# Patient Record
Sex: Female | Born: 1935 | Race: White | Hispanic: No | Smoking: Never smoker
Health system: Southern US, Community
[De-identification: ages and names within clinical notes are randomized; demographics above are authoritative.]

## PROBLEM LIST (undated history)

## (undated) DIAGNOSIS — I10 Essential (primary) hypertension: Secondary | ICD-10-CM

## (undated) DIAGNOSIS — E785 Hyperlipidemia, unspecified: Secondary | ICD-10-CM

## (undated) DIAGNOSIS — I2510 Atherosclerotic heart disease of native coronary artery without angina pectoris: Secondary | ICD-10-CM

## (undated) DIAGNOSIS — F0391 Unspecified dementia with behavioral disturbance: Secondary | ICD-10-CM

## (undated) DIAGNOSIS — M1990 Unspecified osteoarthritis, unspecified site: Secondary | ICD-10-CM

## (undated) DIAGNOSIS — E039 Hypothyroidism, unspecified: Secondary | ICD-10-CM

## (undated) DIAGNOSIS — D649 Anemia, unspecified: Secondary | ICD-10-CM

## (undated) DIAGNOSIS — K5790 Diverticulosis of intestine, part unspecified, without perforation or abscess without bleeding: Secondary | ICD-10-CM

## (undated) HISTORY — DX: Anemia, unspecified: D64.9

## (undated) HISTORY — PX: TOTAL ABDOMINAL HYSTERECTOMY: SHX209

## (undated) HISTORY — DX: Atherosclerotic heart disease of native coronary artery without angina pectoris: I25.10

## (undated) HISTORY — PX: CATARACT EXTRACTION: SUR2

## (undated) HISTORY — DX: Unspecified osteoarthritis, unspecified site: M19.90

## (undated) HISTORY — DX: Hyperlipidemia, unspecified: E78.5

## (undated) HISTORY — DX: Diverticulosis of intestine, part unspecified, without perforation or abscess without bleeding: K57.90

## (undated) HISTORY — PX: TONSILLECTOMY AND ADENOIDECTOMY: SUR1326

## (undated) HISTORY — PX: MELANOMA EXCISION: SHX5266

## (undated) HISTORY — DX: Essential (primary) hypertension: I10

## (undated) HISTORY — DX: Hypothyroidism, unspecified: E03.9

---

## 1998-08-05 HISTORY — PX: CORONARY ARTERY BYPASS GRAFT: SHX141

## 1998-08-16 ENCOUNTER — Inpatient Hospital Stay (HOSPITAL_COMMUNITY): Admission: AD | Admit: 1998-08-16 | Discharge: 1998-08-25 | Payer: Self-pay | Admitting: Cardiology

## 1998-08-20 ENCOUNTER — Encounter: Payer: Self-pay | Admitting: Thoracic Surgery (Cardiothoracic Vascular Surgery)

## 1998-08-21 ENCOUNTER — Encounter: Payer: Self-pay | Admitting: Thoracic Surgery (Cardiothoracic Vascular Surgery)

## 1998-08-23 ENCOUNTER — Encounter: Payer: Self-pay | Admitting: Thoracic Surgery (Cardiothoracic Vascular Surgery)

## 1998-08-29 ENCOUNTER — Inpatient Hospital Stay (HOSPITAL_COMMUNITY): Admission: EM | Admit: 1998-08-29 | Discharge: 1998-08-30 | Payer: Self-pay | Admitting: Emergency Medicine

## 1998-08-29 ENCOUNTER — Encounter: Payer: Self-pay | Admitting: Emergency Medicine

## 1998-09-03 ENCOUNTER — Encounter: Payer: Self-pay | Admitting: Thoracic Surgery (Cardiothoracic Vascular Surgery)

## 1998-09-03 ENCOUNTER — Emergency Department (HOSPITAL_COMMUNITY): Admission: EM | Admit: 1998-09-03 | Discharge: 1998-09-03 | Payer: Self-pay | Admitting: Emergency Medicine

## 1998-09-16 ENCOUNTER — Encounter (INDEPENDENT_AMBULATORY_CARE_PROVIDER_SITE_OTHER): Payer: Self-pay | Admitting: *Deleted

## 2000-08-11 ENCOUNTER — Encounter: Payer: Self-pay | Admitting: Internal Medicine

## 2000-08-11 ENCOUNTER — Ambulatory Visit (HOSPITAL_COMMUNITY): Admission: RE | Admit: 2000-08-11 | Discharge: 2000-08-11 | Payer: Self-pay | Admitting: Internal Medicine

## 2000-08-20 ENCOUNTER — Encounter: Payer: Self-pay | Admitting: Internal Medicine

## 2000-08-20 ENCOUNTER — Ambulatory Visit (HOSPITAL_COMMUNITY): Admission: RE | Admit: 2000-08-20 | Discharge: 2000-08-20 | Payer: Self-pay | Admitting: Internal Medicine

## 2000-09-03 ENCOUNTER — Encounter: Payer: Self-pay | Admitting: Internal Medicine

## 2000-09-03 ENCOUNTER — Ambulatory Visit (HOSPITAL_COMMUNITY): Admission: RE | Admit: 2000-09-03 | Discharge: 2000-09-03 | Payer: Self-pay | Admitting: Internal Medicine

## 2001-06-09 ENCOUNTER — Encounter: Payer: Self-pay | Admitting: Family Medicine

## 2001-06-09 ENCOUNTER — Ambulatory Visit (HOSPITAL_COMMUNITY): Admission: RE | Admit: 2001-06-09 | Discharge: 2001-06-09 | Payer: Self-pay | Admitting: Family Medicine

## 2001-08-15 ENCOUNTER — Encounter: Payer: Self-pay | Admitting: Family Medicine

## 2001-08-15 ENCOUNTER — Ambulatory Visit (HOSPITAL_COMMUNITY): Admission: RE | Admit: 2001-08-15 | Discharge: 2001-08-15 | Payer: Self-pay | Admitting: Family Medicine

## 2002-07-17 ENCOUNTER — Ambulatory Visit (HOSPITAL_COMMUNITY): Admission: RE | Admit: 2002-07-17 | Discharge: 2002-07-17 | Payer: Self-pay | Admitting: Family Medicine

## 2002-07-17 ENCOUNTER — Encounter: Payer: Self-pay | Admitting: Family Medicine

## 2002-08-24 ENCOUNTER — Ambulatory Visit (HOSPITAL_COMMUNITY): Admission: RE | Admit: 2002-08-24 | Discharge: 2002-08-24 | Payer: Self-pay | Admitting: Family Medicine

## 2002-08-24 ENCOUNTER — Encounter: Payer: Self-pay | Admitting: Family Medicine

## 2003-01-11 ENCOUNTER — Encounter: Payer: Self-pay | Admitting: Family Medicine

## 2003-01-11 ENCOUNTER — Ambulatory Visit (HOSPITAL_COMMUNITY): Admission: RE | Admit: 2003-01-11 | Discharge: 2003-01-11 | Payer: Self-pay | Admitting: Family Medicine

## 2003-04-09 ENCOUNTER — Ambulatory Visit (HOSPITAL_COMMUNITY): Admission: RE | Admit: 2003-04-09 | Discharge: 2003-04-09 | Payer: Self-pay | Admitting: Family Medicine

## 2003-04-17 ENCOUNTER — Ambulatory Visit (HOSPITAL_COMMUNITY): Admission: RE | Admit: 2003-04-17 | Discharge: 2003-04-17 | Payer: Self-pay | Admitting: Family Medicine

## 2003-04-18 ENCOUNTER — Inpatient Hospital Stay (HOSPITAL_COMMUNITY): Admission: AD | Admit: 2003-04-18 | Discharge: 2003-04-21 | Payer: Self-pay | Admitting: Family Medicine

## 2003-04-30 ENCOUNTER — Ambulatory Visit (HOSPITAL_COMMUNITY): Admission: RE | Admit: 2003-04-30 | Discharge: 2003-04-30 | Payer: Self-pay | Admitting: Family Medicine

## 2003-10-03 ENCOUNTER — Ambulatory Visit (HOSPITAL_COMMUNITY): Admission: RE | Admit: 2003-10-03 | Discharge: 2003-10-03 | Payer: Self-pay | Admitting: Family Medicine

## 2003-10-09 ENCOUNTER — Ambulatory Visit (HOSPITAL_COMMUNITY): Admission: RE | Admit: 2003-10-09 | Discharge: 2003-10-09 | Payer: Self-pay | Admitting: Family Medicine

## 2004-07-14 ENCOUNTER — Ambulatory Visit (HOSPITAL_COMMUNITY): Admission: RE | Admit: 2004-07-14 | Discharge: 2004-07-14 | Payer: Self-pay | Admitting: Family Medicine

## 2004-07-30 IMAGING — US US ABDOMEN COMPLETE
1 series · 14 of 25 positions shown · non-contrast
Comparison: none

CLINICAL DATA: Abdominal pain; nausea
ULTRASOUND ABDOMEN COMPLETE
Gallbladder physiologically distended without stones or wall thickening.  Common bile duct normal caliber 3 mm diameter.  Liver, spleen, and kidneys unremarkable.  Kidneys measure 11.5 cm length right and 11.2 cm length left.  Aorta and IVC normal appearance.
Bilobed cystic nodule identified at pancreatic head.  This measures 2.4 x 1.8 x 1.7 cm in size.  This contains a single septation versus two adjacent cystic areas.  Features are nonspecific by ultrasound.  The smaller of the cystic loculation appears to contain low level echogenicity. 
IMPRESSION
Complicated cystic mass, head of pancreas.  This corresponds in size and position with the CT abnormality.  One of the locules appears mildly complicated by ultrasound.  MRI of the pancreas with and without contrast recommended to characterize this lesion. 
Remainder of exam unremarkable.

[Series 1: unknown · 0.28mm/px · 14 of 63 slices shown]
[im 1/63]
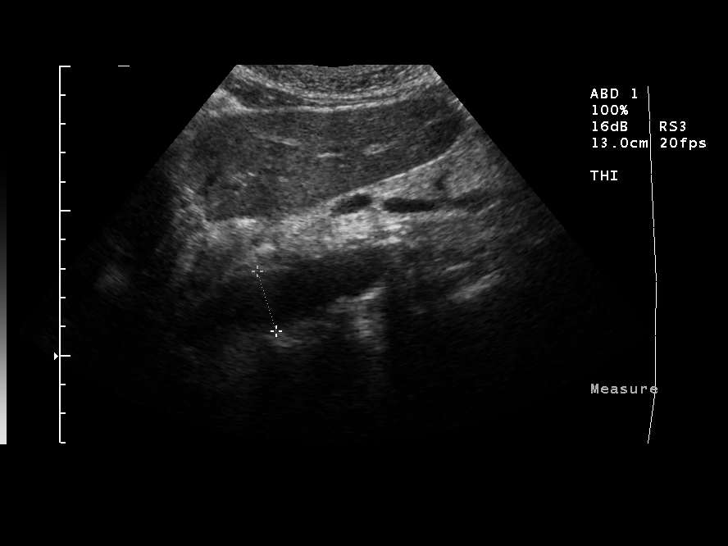
[im 6/63]
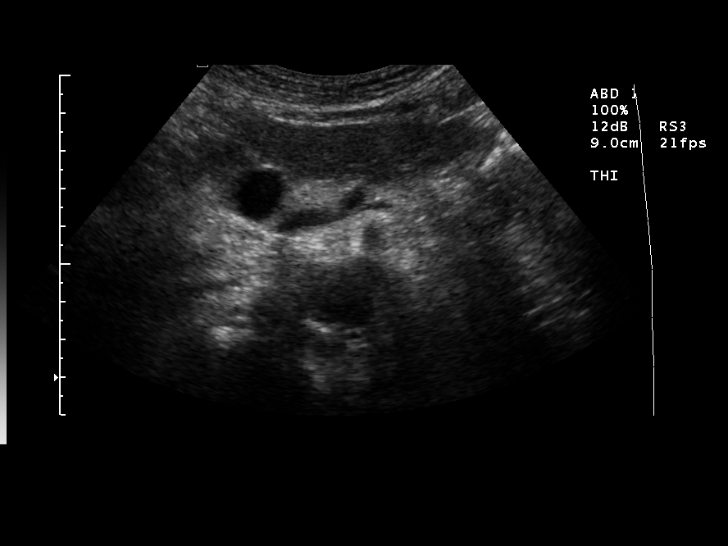
[im 11/63]
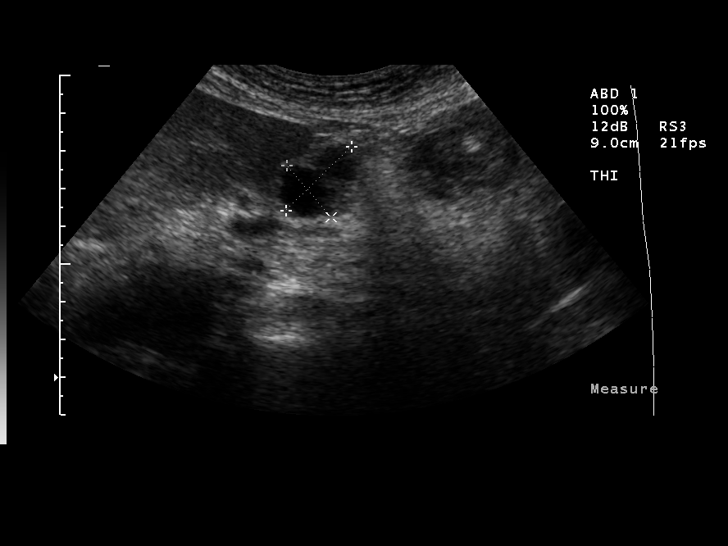
[im 16/63]
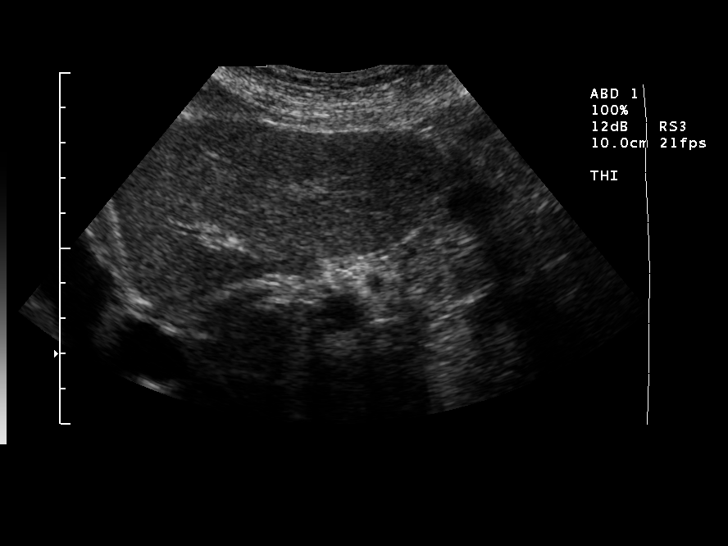
[im 21/63]
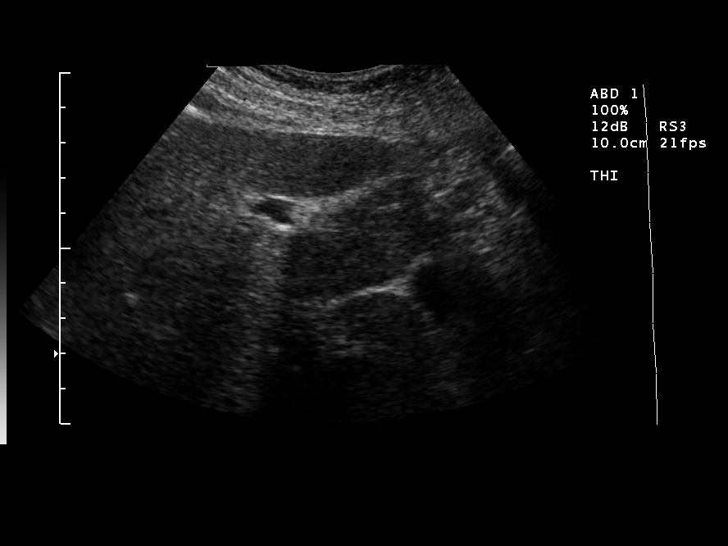
[im 24/63]
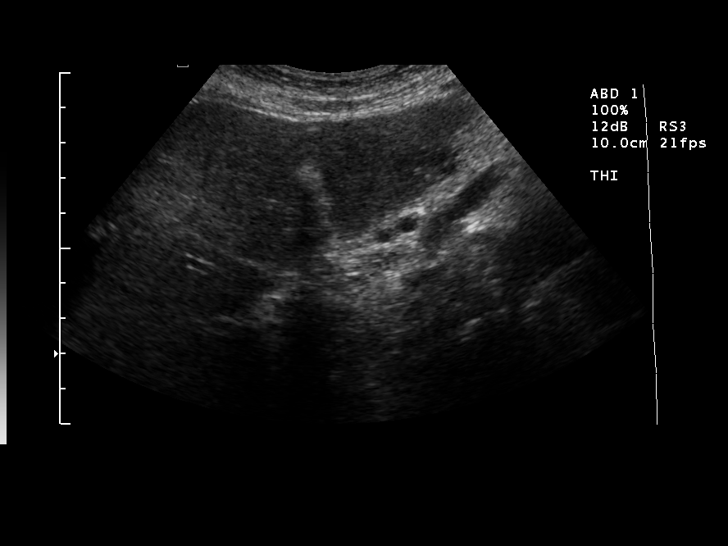
[im 29/63]
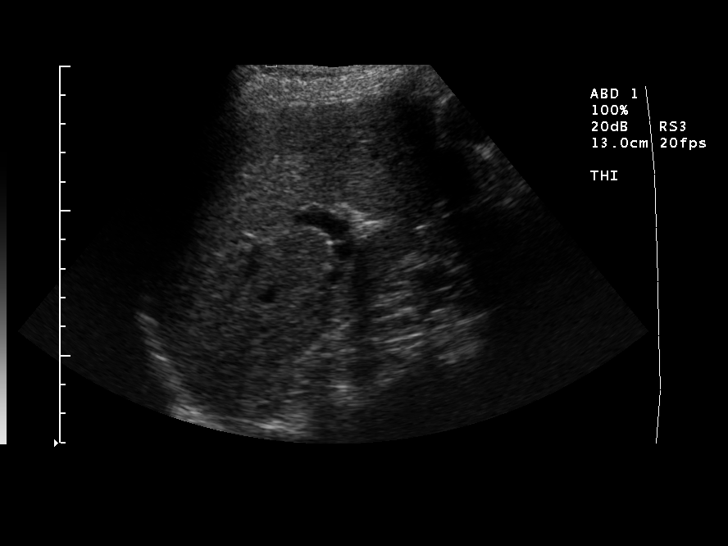
[im 34/63]
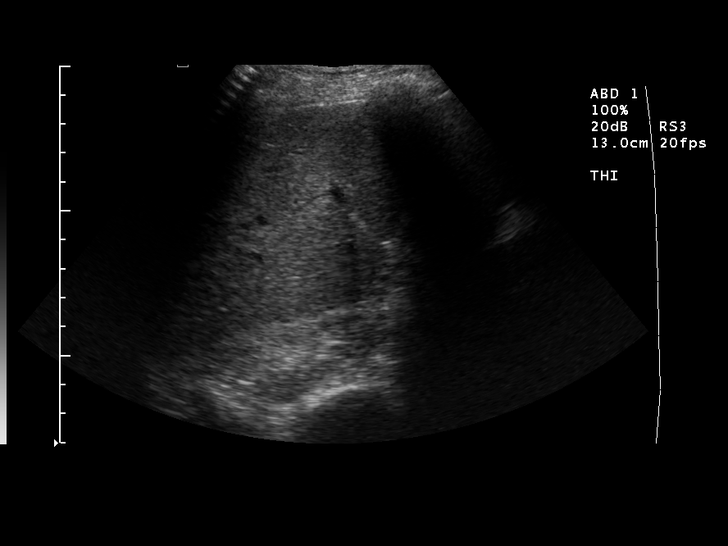
[im 39/63]
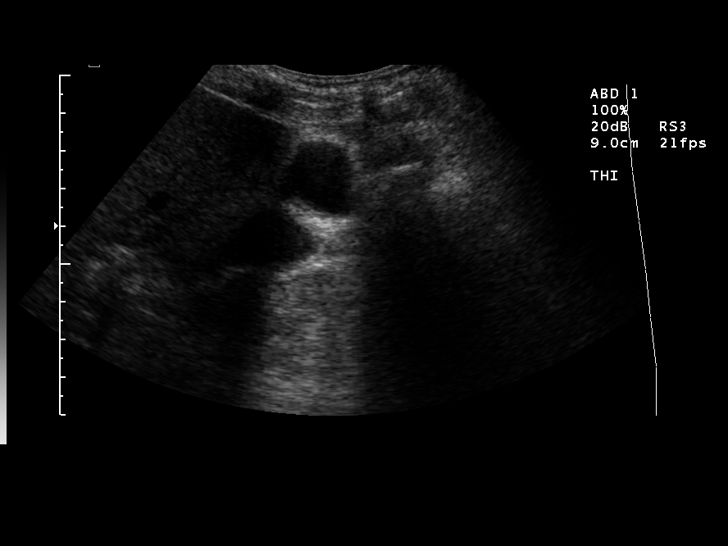
[im 42/63]
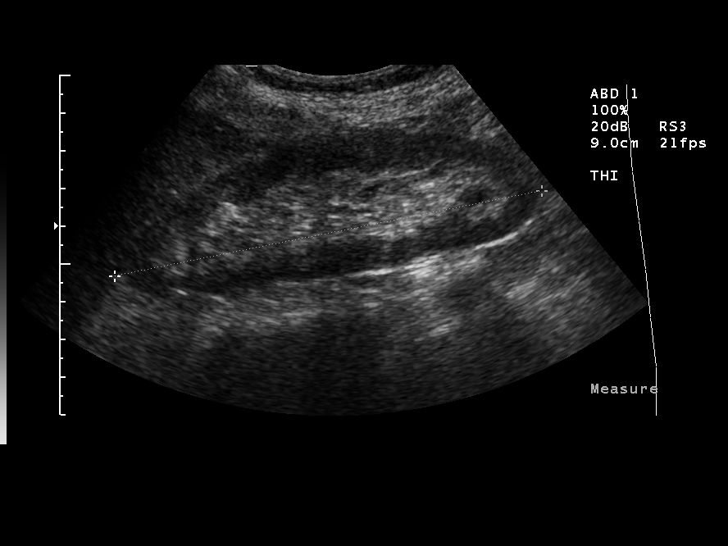
[im 47/63]
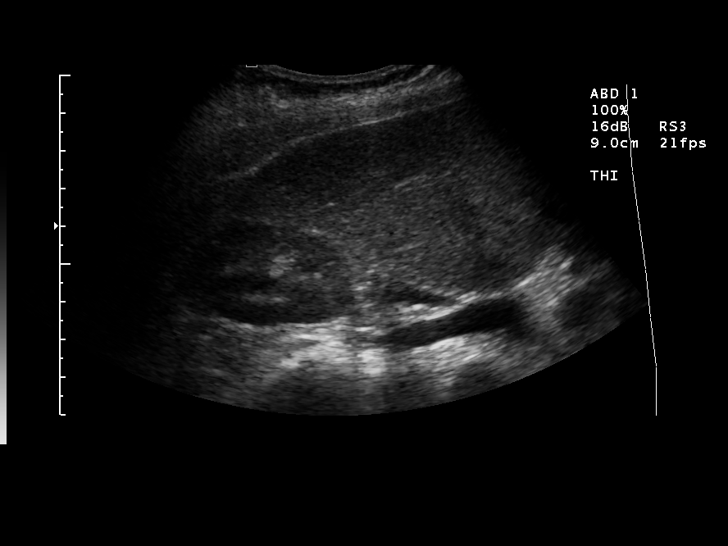
[im 52/63]
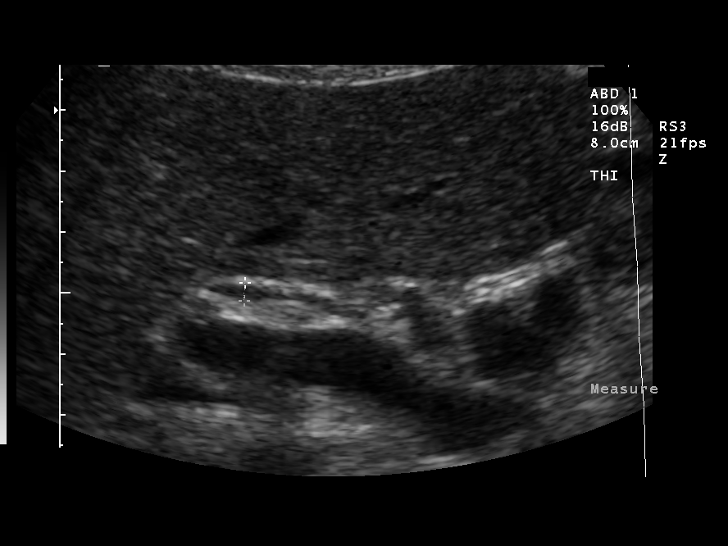
[im 57/63]
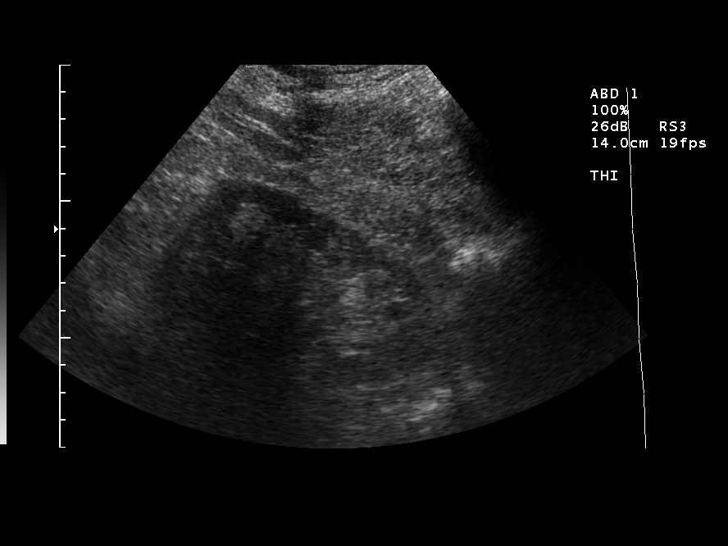
[im 63/63]
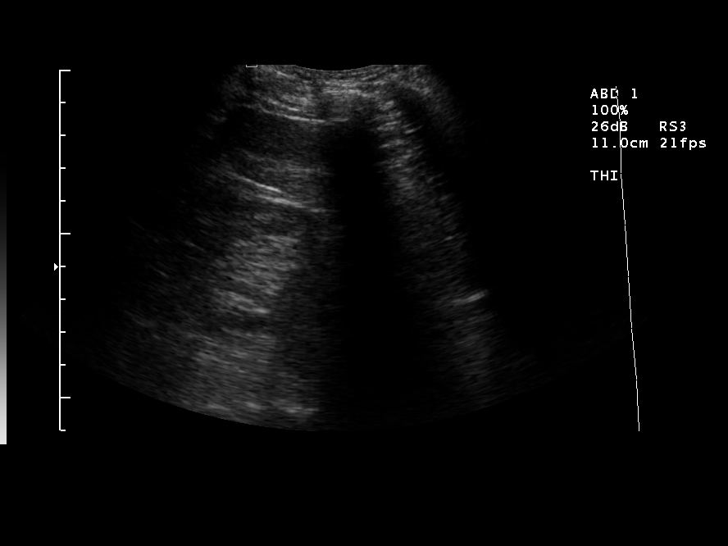

[14 of 25 positions shown; findings below may reference images not displayed]

## 2004-11-05 ENCOUNTER — Ambulatory Visit (HOSPITAL_COMMUNITY): Admission: RE | Admit: 2004-11-05 | Discharge: 2004-11-05 | Payer: Self-pay | Admitting: Family Medicine

## 2005-01-30 ENCOUNTER — Ambulatory Visit (HOSPITAL_COMMUNITY): Admission: RE | Admit: 2005-01-30 | Discharge: 2005-01-30 | Payer: Self-pay | Admitting: Family Medicine

## 2005-02-13 ENCOUNTER — Ambulatory Visit: Payer: Self-pay | Admitting: Cardiology

## 2005-06-03 ENCOUNTER — Ambulatory Visit (HOSPITAL_COMMUNITY): Admission: RE | Admit: 2005-06-03 | Discharge: 2005-06-03 | Payer: Self-pay | Admitting: Family Medicine

## 2005-06-05 ENCOUNTER — Ambulatory Visit (HOSPITAL_COMMUNITY): Admission: RE | Admit: 2005-06-05 | Discharge: 2005-06-05 | Payer: Self-pay | Admitting: Family Medicine

## 2005-06-08 ENCOUNTER — Ambulatory Visit: Payer: Self-pay | Admitting: Cardiology

## 2005-06-11 ENCOUNTER — Ambulatory Visit (HOSPITAL_COMMUNITY): Admission: RE | Admit: 2005-06-11 | Discharge: 2005-06-11 | Payer: Self-pay | Admitting: Family Medicine

## 2005-07-21 ENCOUNTER — Inpatient Hospital Stay (HOSPITAL_COMMUNITY): Admission: RE | Admit: 2005-07-21 | Discharge: 2005-07-24 | Payer: Self-pay | Admitting: Orthopedic Surgery

## 2005-08-05 ENCOUNTER — Encounter: Payer: Self-pay | Admitting: Emergency Medicine

## 2005-11-09 ENCOUNTER — Encounter (HOSPITAL_COMMUNITY): Admission: RE | Admit: 2005-11-09 | Discharge: 2005-12-09 | Payer: Self-pay | Admitting: Endocrinology

## 2005-12-29 ENCOUNTER — Encounter (HOSPITAL_COMMUNITY): Admission: RE | Admit: 2005-12-29 | Discharge: 2006-01-01 | Payer: Self-pay | Admitting: Endocrinology

## 2006-02-04 ENCOUNTER — Ambulatory Visit (HOSPITAL_COMMUNITY): Admission: RE | Admit: 2006-02-04 | Discharge: 2006-02-04 | Payer: Self-pay | Admitting: Internal Medicine

## 2006-12-10 ENCOUNTER — Ambulatory Visit: Payer: Self-pay | Admitting: Cardiology

## 2007-02-21 ENCOUNTER — Ambulatory Visit (HOSPITAL_COMMUNITY): Admission: RE | Admit: 2007-02-21 | Discharge: 2007-02-21 | Payer: Self-pay | Admitting: Family Medicine

## 2008-10-01 DIAGNOSIS — K5730 Diverticulosis of large intestine without perforation or abscess without bleeding: Secondary | ICD-10-CM | POA: Insufficient documentation

## 2008-10-01 DIAGNOSIS — H9190 Unspecified hearing loss, unspecified ear: Secondary | ICD-10-CM

## 2008-10-01 DIAGNOSIS — M1710 Unilateral primary osteoarthritis, unspecified knee: Secondary | ICD-10-CM

## 2008-11-07 ENCOUNTER — Ambulatory Visit: Payer: Self-pay | Admitting: Cardiology

## 2008-11-07 ENCOUNTER — Encounter: Payer: Self-pay | Admitting: Cardiology

## 2008-11-07 DIAGNOSIS — I2510 Atherosclerotic heart disease of native coronary artery without angina pectoris: Secondary | ICD-10-CM | POA: Insufficient documentation

## 2009-05-23 ENCOUNTER — Encounter (INDEPENDENT_AMBULATORY_CARE_PROVIDER_SITE_OTHER): Payer: Self-pay | Admitting: *Deleted

## 2009-05-23 LAB — CONVERTED CEMR LAB
AST: 12 units/L
Albumin: 4.2 g/dL
Alkaline Phosphatase: 113 units/L
CO2: 95 meq/L
Chloride: 22 meq/L
Creatinine, Ser: 0.82 mg/dL
HCT: 35.7 %
Hemoglobin: 11.3 g/dL
LDL Cholesterol: 106 mg/dL
Platelets: 288 10*3/uL
Sodium: 4.7 meq/L
Total Protein: 6.6 g/dL
WBC: 7.9 10*3/uL

## 2009-05-24 ENCOUNTER — Encounter (INDEPENDENT_AMBULATORY_CARE_PROVIDER_SITE_OTHER): Payer: Self-pay | Admitting: *Deleted

## 2009-11-08 ENCOUNTER — Encounter (INDEPENDENT_AMBULATORY_CARE_PROVIDER_SITE_OTHER): Payer: Self-pay | Admitting: *Deleted

## 2009-11-08 LAB — CONVERTED CEMR LAB
AST: 13 units/L
Alkaline Phosphatase: 95 units/L
BUN: 12 mg/dL
Basophils Relative: 0 %
Eosinophils Absolute: 0.2 10*3/uL
Glucose, Bld: 93 mg/dL
HCT: 36.3 %
Hemoglobin: 11.8 g/dL
MCHC: 32.5 g/dL
Monocytes Relative: 7 %
RDW: 15.2 %

## 2009-12-04 ENCOUNTER — Encounter (INDEPENDENT_AMBULATORY_CARE_PROVIDER_SITE_OTHER): Payer: Self-pay | Admitting: *Deleted

## 2009-12-06 ENCOUNTER — Ambulatory Visit: Payer: Self-pay | Admitting: Cardiology

## 2009-12-06 DIAGNOSIS — D649 Anemia, unspecified: Secondary | ICD-10-CM | POA: Insufficient documentation

## 2009-12-06 DIAGNOSIS — E018 Other iodine-deficiency related thyroid disorders and allied conditions: Secondary | ICD-10-CM

## 2010-04-27 ENCOUNTER — Encounter: Payer: Self-pay | Admitting: Family Medicine

## 2010-05-06 NOTE — Miscellaneous (Signed)
Summary: LABS CMP,CBCD,TSH,11/08/2009  Clinical Lists Changes  Observations: Added new observation of ABSOLUTE BAS: 0.0 K/uL (11/08/2009 10:53) Added new observation of BASOPHIL %: 0 % (11/08/2009 10:53) Added new observation of EOS ABSLT: 0.2 K/uL (11/08/2009 10:53) Added new observation of % EOS AUTO: 2 % (11/08/2009 10:53) Added new observation of ABSOLUTE MON: 0.6 K/uL (11/08/2009 10:53) Added new observation of MONOCYTE %: 7 % (11/08/2009 10:53) Added new observation of ABS LYMPHOCY: 2.0 K/uL (11/08/2009 10:53) Added new observation of LYMPHS %: 23 % (11/08/2009 10:53) Added new observation of PLATELETK/UL: 295 K/uL (11/08/2009 10:53) Added new observation of RDW: 15.2 % (11/08/2009 10:53) Added new observation of MCHC RBC: 32.5 g/dL (16/01/9603 54:09) Added new observation of MCV: 92.1 fL (11/08/2009 10:53) Added new observation of HCT: 36.3 % (11/08/2009 10:53) Added new observation of HGB: 11.8 g/dL (81/19/1478 29:56) Added new observation of RBC M/UL: 3.94 M/uL (11/08/2009 10:53) Added new observation of WBC COUNT: 8.6 10*3/microliter (11/08/2009 10:53) Added new observation of CALCIUM: 9.3 mg/dL (21/30/8657 84:69) Added new observation of ALBUMIN: 4.1 g/dL (62/95/2841 32:44) Added new observation of PROTEIN, TOT: 6.7 g/dL (04/08/7251 66:44) Added new observation of SGPT (ALT): 9 units/L (11/08/2009 10:53) Added new observation of SGOT (AST): 13 units/L (11/08/2009 10:53) Added new observation of ALK PHOS: 95 units/L (11/08/2009 10:53) Added new observation of CREATININE: 0.75 mg/dL (03/47/4259 56:38) Added new observation of BUN: 12 mg/dL (75/64/3329 51:88) Added new observation of BG RANDOM: 93 mg/dL (41/66/0630 16:01) Added new observation of CO2 PLSM/SER: 26 meq/L (11/08/2009 10:53) Added new observation of CL SERUM: 98 meq/L (11/08/2009 10:53) Added new observation of K SERUM: 4.9 meq/L (11/08/2009 10:53) Added new observation of NA: 134 meq/L (11/08/2009 10:53) Added  new observation of TSH: 2.657 microintl units/mL (11/08/2009 10:53)

## 2010-05-06 NOTE — Miscellaneous (Signed)
Summary: cmp,cbc,lipids,tsh  Clinical Lists Changes  Observations: Added new observation of CALCIUM: 9.6 mg/dL (16/01/9603 54:09) Added new observation of ALBUMIN: 4.2 g/dL (81/19/1478 29:56) Added new observation of PROTEIN, TOT: 6.6 g/dL (21/30/8657 84:69) Added new observation of SGPT (ALT): 8 units/L (05/23/2009 15:44) Added new observation of SGOT (AST): 12 units/L (05/23/2009 15:44) Added new observation of ALK PHOS: 113 units/L (05/23/2009 15:44) Added new observation of BILI DIRECT: total bili    0.3 mg/dL (62/95/2841 32:44) Added new observation of GFR AA: >60 mL/min/1.53m2 (05/23/2009 15:44) Added new observation of GFR: >60 mL/min (05/23/2009 15:44) Added new observation of CREATININE: 0.82 mg/dL (04/08/7251 66:44) Added new observation of BUN: 18 mg/dL (03/47/4259 56:38) Added new observation of BG RANDOM: 18 mg/dL (75/64/3329 51:88) Added new observation of CO2 PLSM/SER: 95 meq/L (05/23/2009 15:44) Added new observation of CL SERUM: 22 meq/L (05/23/2009 15:44) Added new observation of K SERUM: 98. meq/L (05/23/2009 15:44) Added new observation of NA: 4.7 meq/L (05/23/2009 15:44) Added new observation of LDL: 106 mg/dL (41/66/0630 16:01) Added new observation of HDL: 49 mg/dL (09/32/3557 32:20) Added new observation of TRIGLYC TOT: 134 mg/dL (25/42/7062 37:62) Added new observation of CHOLESTEROL: 182 mg/dL (83/15/1761 60:73) Added new observation of PLATELETK/UL: 288 K/uL (05/23/2009 15:44) Added new observation of MCV: 91.3 fL (05/23/2009 15:44) Added new observation of HCT: 35.7 % (05/23/2009 15:44) Added new observation of HGB: 11.3 g/dL (71/09/2692 85:46) Added new observation of WBC COUNT: 7.9 10*3/microliter (05/23/2009 15:44) Added new observation of TSH: 3.252 microintl units/mL (05/23/2009 15:44)

## 2010-05-06 NOTE — Assessment & Plan Note (Signed)
Summary: 1 YR F/U PER CHECKOUT ON 11/07/08/TG  Medications Added XYZAL 5 MG TABS (LEVOCETIRIZINE DIHYDROCHLORIDE) take 1 tab daily      Allergies Added: NKDA  Visit Type:  Follow-up Primary Provider:  Dr. Assunta Found   History of Present Illness: Return visit for this remarkable, nearly 77 year old woman, who is now 11 years status post CABG surgery.  She lives alone and drives, but has family in the area to assist her as necessary.  She has an active lifestyle for a woman her age, but does not exercise regularly.  She denies dyspnea, orthopnea, PND, chest discomfort, lightheadedness and syncope.  She feels that her balance is somewhat impaired, but has not fallen.  Recent laboratory studies were excellent, but she was told to start a multivitamin containing iron after those results became available.  Current Medications (verified): 1)  Aspirin 81 Mg Tbec (Aspirin) .... Take One Tablet By Mouth Daily 2)  Metoprolol Tartrate 100 Mg Tabs (Metoprolol Tartrate) .... Take One Tablet By Mouth Twice A Day 3)  Synthroid 50 Mcg Tabs (Levothyroxine Sodium) .... Take 1 Tablet By Mouth Once A Day 4)  Benicar 40 Mg Tabs (Olmesartan Medoxomil) .... Take 1 Tablet By Mouth Once A Day 5)  Xyzal 5 Mg Tabs (Levocetirizine Dihydrochloride) .... Take 1 Tab Daily  Allergies (verified): No Known Drug Allergies  Past History:  PMH, FH, and Social History reviewed and updated.  Past Medical History: ASCVD-CABG surgery in 5/00 HYPERTENSION, UNSPECIFIED (ICD-401.9) HYPERLIPIDEMIA Anemia-mild; hemoglobin of 11.5-12 Hearing Impairment Hypothyroid: Initially presented with toxic goiter; levothyroxine Rx needed after RAI in 2008 DIVERTICULAR DISEASE (ICD-562.10) DEGENERATIVE JOINT DISEASE, LEFT KNEE (ICD-715.96)     Past Surgical History: CABG- 08/1998 T&A Abdominal Hysterectomy-Total Melanoma removed from the right arm Bladder resuspension Cataract extraction with lens implantation  Review of  Systems       See history of present illness  Vital Signs:  Patient profile:   77 year old female Height:      63 inches Weight:      122 pounds BMI:     21.69 Pulse rate:   60 / minute BP sitting:   133 / 69  (right arm)  Vitals Entered By: Dreama Saa, CNA (December 06, 2009 2:39 PM)  Physical Exam  General:  Elderly woman who is well developed; no acute distress; impaired hearing only modestly limits speech comprehension Neck-No JVD; no carotid bruits: Lungs-No tachypnea, no rales; no rhonchi; no wheezes: Cardiovascular-normal S1; increased intensity of S2 with normal split; minimal early systolic ejection murmur: Abdomen-BS normal; soft and non-tender without masses or organomegaly:  Musculoskeletal-No deformities, no cyanosis or clubbing: Neurologic-Normal cranial nerves; symmetric strength and tone:  Skin-Warm, no significant lesions: Extremities-1-2+ distal pulses except for a decreased left posterior tibial; when assessed with Doppler, signal is monophasic and deformed suggesting significant obstruction; trace edema:     Impression & Recommendations:  Problem # 1:  ATHEROSCLEROTIC CARDIOVASCULAR DISEASE (ICD-429.2) Patient is asymptomatic and requires no additional medical therapy for this problem.  Problem # 2:  HYPERTENSION, UNSPECIFIED (ICD-401.9) Blood pressure control is good, and current medications will be continued.  I will plan to reassess this nice woman in one year.  Patient Instructions: 1)  Your physician recommends that you schedule a follow-up appointment in: 1 year 2)  Your physician recommends that you continue on your current medications as directed. Please refer to the Current Medication list given to you today.

## 2010-08-19 NOTE — Letter (Signed)
December 10, 2006    Olivia Nixon, M.D.  [address] [place]. [place], [place] [address]   RE:  Olivia Nixon, Olivia Nixon  MRN:  [id number]  /  DOB:  1917-11-28   Dear Olivia Nixon:   Olivia Nixon returns to the office for continued assessment and treatment  of cardiovascular risk factors, now 8 years following CABG surgery.  She  remains asymptomatic from a cardiac standpoint.  She has undergone left  total knee arthroplasty and thyroid ablation for a toxic goiter over the  past 18 months or so.  She remains remarkably functional, except for  markedly decreased hearing acuity.   CURRENT MEDICATIONS:  1. Aspirin 81 mg daily.  2. Lisinopril 10 mg daily.  3. Lunesta 2 mg nightly p.r.n.  4. Levothyroxine 50 mcg daily.  5. Clarinex 5 mg daily.  6. Metoprolol 100 mg b.i.d.  7. She also takes an unknown nonsteroidal antiinflammatory for her      arthritis.   EXAM:  [name].  Weight 126 pounds, 3 pounds less than in March 2007.  Blood pressure  125/85, heart rate 65 and regular, respirations 18.  NECK:  No jugular venous distension; no carotid bruits.  LUNGS:  Clear.  CARDIAC:  Normal first heart sound; increased intensity of the second  heart sound; fourth heart sound and modest basalar systolic ejection  murmur noted.  ABDOMEN:  Soft and nontender; normal bowel sounds; no bruits; no masses;  no organomegaly.  EXTREMITIES:  Distal pulses intact; no edema.   LABORATORY:  Obtained today, pending.  Most recent testing available to  me was in January, at which time CBC was normal, as was chemistry  profile.  Her cholesterol was elevated at 219 with triglycerides 105,  HDL 51, and LDL of 147.   IMPRESSION:  Olivia Nixon is doing generally well.  She has taken both  Lipitor and Vytorin in the past, but has discontinued these.  I told her  that it is reasonably important for her to take the lipid lowering  medication.  She will start simvastatin 40 mg daily and return to see me  in 1  year.  Hypertension is well controlled.  She is advised to wear her  hearing aids.    Sincerely,      Olivia Friends. Dietrich Pates, MD, [facility]  Electronically Signed    RMR/MedQ  DD: 12/10/2006  DT: 12/10/2006  Job #: [id number]

## 2010-08-22 NOTE — H&P (Signed)
Olivia Nixon, Olivia Nixon                 ACCOUNT NO.:  [id number]   MEDICAL RECORD NO.:  [id number]          PATIENT TYPE:  INP   LOCATION:  NA                           FACILITY:  [facility]   PHYSICIAN:  Olivia Nixon, M.D.  DATE OF BIRTH:  June 23, 1917   DATE OF ADMISSION:  07/21/2005  DATE OF DISCHARGE:                                HISTORY & PHYSICAL   CHIEF COMPLAINT:  Pain in my knees, worse on left than right.   HISTORY OF PRESENT ILLNESS:  This is an 77 year old lady who has had  progressive problems concerning pain into her knees.  She has had more and  more difficulty getting about. She is a very active lady and is highly  desirous to improve her overall lifestyle.  She now has to use a walker for  any kind of ambulation.  She also has difficulty getting to a standing  position from a seated position.  X-rays have shown bone on bone deformities  in both knees, worse on the left than the right. She has crepitus of range  of motion.  This is also quite painful.  The patient has been cleared  preoperatively by Dr. Corrie Mckusick at [facility] in  [place], [place].  After much discussion, including the risks and  benefits of surgery, it was felt this patient would benefit with surgical  intervention and is being admitted for total knee replacement arthroplasty  on the left.  If she does well with this procedure and is medically stable,  we will probably go ahead with total knee replacement arthroplasty on the  right knee in the not too distant future.   PAST MEDICAL HISTORY:  1.  This patient had a myocardial infarction in the year 2000.  She had a      cardiac catheterization.  2.  She also has diverticulitis.  3.  Hemorrhoids.  4.  Osteoarthritis is her most significant problem.   ALLERGIES:  SULFA, CODEINE, CHLORTHALIDONE, AMBIEN, VALIUM, REQUIP,  DARVOCET, CELEBREX, ORUVAIL, VOLTAREN, DULCOLAX, CIPRO, FLAGYL, INTRAVENOUS  IODINE PREPARATIONS (such as  IVP dye).   CURRENT MEDICATIONS:  1.  Metoprolol one tablet b.i.d.  2.  Vytorin one tablet daily.  3.  Baby aspirin (which she will stop prior to surgery).  4.  Tylenol, two t.i.d.  5.  Os-Cal.  6.  Lunesta at bedtime.  7.  She is now using Zyrtec as an allergen.   PAST SURGICAL HISTORY:  Include a tonsillectomy and adenoidectomy, cardiac  catheterization, hysterectomy, melanoma removed from the right arm, bladder  tacking and lens implants.   FAMILY HISTORY:  Noncontributory.   SOCIAL HISTORY:  The patient is widowed.  She is retired.  She has no intake  of alcohol or tobacco products.  Her sister will probably be her caregiver  after surgery.   REVIEW OF SYSTEMS:  CNS:  No seizure disorder, paralysis, numbness, double  vision.  RESPIRATORY:  No productive cough, no hemoptysis, no shortness of  breath.  CARDIOVASCULAR:  No chest pain.  No angina, no orthopnea.  GASTROINTESTINAL:  No  nausea, vomiting, melena or bloody stools.  GENITOURINARY:  No discharge, dysuria or hematuria. MUSCULOSKELETAL:  Primarily in present illness with her knees.  She also has other multiple  arthralgias.   PHYSICAL EXAMINATION:  GENERAL APPEARANCE:  This is an alert and  cooperative, friendly, 76 year old female who is walking with a walker. She  is accompanied by her sister.  VITAL SIGNS:  Blood pressure 132/72, pulse 76 regular, respirations 12,  unlabored.  HEENT:  Normocephalic, atraumatic. Pupils equal, round, reactive to light  and accommodation. Extraocular movements intact. Oropharynx is clear.  CHEST:  Clear to auscultation.  No rhonchi and no rales.  HEART:  Regular rate and rhythm with no murmurs heard.  ABDOMEN: Soft, nontender, slightly obese.  Liver and spleen not felt.  GENITOURINARY/RECTAL/PELVIC/BREASTS:  Not done, not pertinent to present  illness.  EXTREMITIES:  As in present illness above.   ADMISSION DIAGNOSES:  1.  Severe osteoarthritis both knees, worse on the left than  the right.  2.  Coronary artery disease.  3.  History of melanoma of right arm.   PLAN:  The patient will undergo right total knee replacement arthroplasty.  Her sister has said that if there is to be any post hospitalization  rehabilitation, such as a skilled nursing facility, they strongly request  [facility] facility.  We will certainly try to accommodate these wishes at  the patient's time of discharge.  Hopefully, she will be able to go home  with home health as previously planned.      [name]      Olivia Nixon, M.D.  Electronically Signed    DLU/MEDQ  D:  07/15/2005  T:  07/15/2005  Job:  [id number]   cc:   Corrie Mckusick, M.D.  Fax: [phone]

## 2010-08-22 NOTE — Consult Note (Signed)
NAME:  Olivia Nixon, Olivia Nixon                           ACCOUNT NO.:  [id number]   MEDICAL RECORD NO.:  [id number]                   PATIENT TYPE:  INP   LOCATION:  A318                                 FACILITY:  [facility]   PHYSICIAN:  R. Roetta Sessions, M.D.              DATE OF BIRTH:  1917-08-26   DATE OF CONSULTATION:  DATE OF DISCHARGE:                                   CONSULTATION   PHYSICIAN REQUESTING CONSULTATION:  Dr. Karleen Hampshire.   REASON FOR CONSULTATION:  1. Obstipation.  2. Abdominal pain.  3. Loss of appetite.   DATE OF CONSULTATION:  April 19, 2003.   HISTORY OF PRESENT ILLNESS:  Patient is an 77 year old Caucasian female who  was admitted yesterday with hyponatremia, hypokalemia and obstipation.  She  had been seen multiple times as an outpatient recently.  Approximately one  week ago she was seen for sinusitis with labyrinthitis and underwent  treatment with antibiotic therapy.  These symptoms gradually resolved.  The  day prior to admission, however she was seen in Dr. Edison Simon office for  persistent loss of appetite, fatigue and lower abdominal pain.  There was  concern for possible obstipation given that she had reported no significant  bowel movement in over one week's duration.  She had used a glycerine  suppository and had a small liquidy smear stool a week prior.  Films in the  office reportedly revealed a large amount of stool.  She had blood work as  well which revealed a sodium of 117, potassium 3.  She was, therefore,  brought in for admission.   Patient states for the last several weeks she has had a loss of appetite and  fatigue.  With her labyrinthitis she developed some dizziness and  staggering.  Again those symptoms have now resolved.  She felt she was  having a BM because she was eating very little.  She has had chronic  constipation.  She denies any melena or rectal bleeding.  She has had some  nausea but no vomiting.  She has had some indigestion  as well.  When she  came in her abdominal pain was in the mid to lower abdomen.  She describes  it as a soreness now.   Upon admission her sodium was 120, potassium 3, BUN 12, creatinine .8.  Abdominal films on April 17, 2003, have been read by the radiologist and  revealed chronic lung changes but no abdominal abnormalities.  Patient had  soapsuds enema last night with no result.  She is getting MiraLax b.i.d. as  well.  This morning her abdominal pain is less and she feels much stronger.  She tolerated a regular diet.  Today her sodium is 129, potassium 3.8.   MEDICATIONS PRIOR TO ADMISSION:  1. Chlorthalidone 12.5 mg daily.  2. Lopressor 25 mg b.i.d.  3. Aspirin 81 mg daily.  4. Previously on Bextra but has  been discontinued.  5. Recent antibiotic therapy.   ALLERGIES:  IODINE, CELEBREX, ORUVAIL, VOLTAREN, SULFA AND DULCOLAX.   PAST MEDICAL HISTORY:  1. Hypertension.  2. History of coronary artery disease status post MI and coronary artery     bypass graft four years ago.  3. Arthritis.  4. Allergies.  5. In May, 2002 she underwent ERCP by Dr. Lina Sar for a questionable     pancreatic mass seen on ultrasound.  Notably, CT did not confirm any     pancreatic mass although did have some type of cystic lesion thought to     be due to previous pancreatitis.  ERCP was normal except for a     periampullary diverticula.  Patient states after that she had a     colonoscopy which revealed diverticula only.  6. She has also had a hysterectomy, appendectomy, bladder tack and     tonsillectomy.   FAMILY HISTORY:  Is very strong for colon and rectal cancer.  She had two  brothers and a father who died in their 91s of colorectal cancer.  She has  another brother who had a cancerous polyp removed and is doing well.   SOCIAL HISTORY:  She is widowed.  She lives here in [place].  She has one  child.  She denies any tobacco or alcohol use.   REVIEW OF SYSTEMS:  Please see HPI for  GI.  GENERAL:  No reported weight  loss.  CARDIOPULMONARY:  Denies any chest pain or shortness of breath.  She  did have productive cough which is now resolved.   PHYSICAL EXAMINATION:  Temp 98, pulse 60, respirations 20, blood pressure  96/54.  GENERAL:  A pleasant, thin, elderly Caucasian female in no acute distress.  SKIN:  Warm and dry, no jaundice.  HEENT:  Pupils equal, round, reactive to light.  Conjunctiva are pink,  sclera are nonicteric.  Oropharyngeal mucosa moist and pink.  No lesions,  erythema or exudate.  No lymphadenopathy, thyromegaly.  CHEST:  Lungs clear to auscultation.  CARDIAC EXAM:  Reveals regular rate and rhythm, normal S1, S2.  No murmurs,  rubs or gallops.  ABDOMEN:  Positive bowel sounds.  Abdomen is full in the lower region with  associated mild tenderness.  There is no masses or hepatosplenomegaly  appreciated.  No rebound tenderness or guarding.  RECTAL EXAMINATION:  Reveals external hemorrhoids.  No masses in the rectal  vault.  Small amount of liquidy stool that is hemoccult negative.  EXTREMITIES:  No edema.   LABS:  As mentioned in HPI.  In addition, today her BUN was 8, creatinine  .9, calcium 8.6, glucose 103.   IMPRESSION:  Patient is an 77 year old Caucasian female with a several week  history of loss of appetite, decreased number of bowel  movements/constipation, lower abdominal pain.  She was admitted with  significant hyponatremia as well as hypokalemia, unclear etiology.  Suspect  her loss of appetite may be related to electrolyte abnormality.  On  examination, her lower abdomen is very full, possibly related to stool load.  There was no evidence of distal impaction on rectal examination.  She has  had a colonoscopy as recently as two to three years ago by Dr. Juanda Chance.   SUGGESTIONS:  1. Will retrieve the last colonoscopy report.  2. Continue MiraLax. 3. Patient to be examined by Dr. Jena Gauss, could consider CT for abdominal     fullness and  pain.  4. CBC, LFTs and lipase.  I would like to thank Dr. Regino Schultze for allowing [place] to take part in the care  of this patient.     ________________________________________  ___________________________________________  Tana Coast, Pricilla Larsson, M.D.   LL/MEDQ  D:  04/19/2003  T:  04/19/2003  Job:  [id number]   cc:   R. Roetta Sessions, M.D.  [address]  [place]  [place] [address]  Fax: [phone]   Kirk Ruths, M.D.  [address]  [place]  [place] [address]  Fax: [phone]

## 2010-08-22 NOTE — Op Note (Signed)
Olivia Nixon, Olivia Nixon                 ACCOUNT NO.:  [id number]   MEDICAL RECORD NO.:  [id number]          PATIENT TYPE:  INP   LOCATION:  1619                         FACILITY:  [facility]   PHYSICIAN:  Madlyn Frankel. Charlann Boxer, M.D.  DATE OF BIRTH:  [birth year]   DATE OF PROCEDURE:  07/21/2005  DATE OF DISCHARGE:                                 OPERATIVE REPORT   PREOPERATIVE DIAGNOSIS:  End-stage left knee osteoarthritis.   POSTOPERATIVE DIAGNOSIS:  End-stage left knee osteoarthritis.   PROCEDURE:  Left total knee replacement.   COMPONENTS USED:  DePuy posterior stabilized rotating platform knee system  with a size 2.5 femur, size 2 tibia, size 10 mm polyethylene liner, 35  patellar button.   SURGEON:  Madlyn Frankel. Charlann Boxer, M.D.   ASSISTANTDruscilla Brownie. Underwood, P.A.-C.   ANESTHESIA:  Spinal plus MAC.   DRAINS:  One.   TOURNIQUET TIME:  49 minutes at 200 mmHg.   BLOOD LOSS:  Minimal.   COMPLICATIONS:  None.   INDICATIONS FOR PROCEDURE:  Ms. Kassing is an 77 year old female who is  referred for evaluation of bilateral knee pain.  She had been having  problems with her back for a long time but radiographs at some point in her  evaluation revealed end-stage changes.  She was subsequently sent for  evaluation.  She had failed conservative measures with an anti-  inflammatories and injection without significant relief.  Despite her age,  she felt her quality of life  was significantly diminished based on her  knees.  She felt her left knee is worse than the right.  We reviewed the  risks and benefits including perioperative complications.  Based on her  cardiac and medical histories, we have discussed the risks of infection,  DVT, component failure, need for revision, arthrofibrosis, and persistent  pain postoperatively.  Consent was obtained for left total knee replacement.   DESCRIPTION OF PROCEDURE:  Patient was brought to the operating theater.  Once adequate anesthesia and preoperative  antibiotics were administered, 1 g  of Ancef, the patient was positioned supine.  Prior to application of the  thigh tourniquet, the patient's pulses were re-examined and found to be  palpable and 2+.  Tourniquet was placed on the proximal thigh tough, chosen  to use 200 mmHg.  Left lower extremity is then prepped and draped in sterile  fashion following a prescrub.  A midline incision was made followed by a  modified medial parapatellar arthrotomy without everting the patella.  It  was just subluxated laterally.   Knee exposure obtained routinely, the patient was found to have severe  degenerative joint disease.   Femoral component was prepared first with intramedullary rod, passed 10 mm  of bone resected off the distal femur at 5 degrees of valgus.  Sized the  femur to be a size 2.5 femur.  It was noted that the patient had excessively  large femoral trochlea laterally despite rotating the femur according to the  white size line as well as to the epicondylar axis.   The anterior, posterior and chamfer cuts were then made  utilizing the 2.5  jig.  There was no femoral notching.  I then prepared the trochlear box  based off the lateral aspect of the distal femur.  At this point, attention  was directed to the tibia.  Tibial exposure was obtained in routine fashion  including meniscectomies.  Next, medullary guide was positioned and 10 mm of  bone was resected off the proximal lateral tibia.   This was perpendicular to the shaft on the tibia.   At this point, a trial reduction was carried out with 2.5 femur, size 2  tibia with a 10 mm polyethylene spacer in addition to the 1.8 mm shim to  account for the difference between fixed bearing and rotating platform.  Rotation was marked on the tibia with the knee in extension.   The knee came to full extension and had good flexion of ligament with  balance stable throughout the flexion to extension.   At this point, with the trial  components in place, attention was directed to  the patella.  Precut measurement was 21 mm.  I resected down to 14 mm then  utilized the 32 patellar button which fit perfectly along the surface of the  cut patella.  Drill holes were made and the trial patella placed.  The  patella tracked without application of thumb pressure.  At this point, trial  components were removed.  Final tibial preparation was carried out after the  rotation marked with the trial fixed bearing  prosthesis.  This included  drilling and keel punch for rotating platform system.  Final components were  brought to the field and knee was irrigated with normal saline solution with  pulse lavage irrigation.  I then injected the knee with 60 mL of Marcaine  with epinephrine and Toradol.   Once the cement was ready, the tibial, femoral and patella composure was  noted initially with 10 mm spacer placed, 10 mm poly placed to the left knee  and the knee brought to extension.  Once the cement had cured, excessive  cement was debrided.  Trial component was removed.  Excessive cement  debrided off the posterior aspect of the knee and the final size 10  posterior stabilized rotating platform new system polyethylene liner placed.   Once the final poly was placed, the knee was irrigated with normal saline  solution.  Medium Hemovac drain was placed deep.  The extension mechanism is  reapproximated with #1 PDS with the knee in flexion.  The remainder of the  wound was closed in layers with staples on the skin based on the thinness of  the epidermal layer.  The wound was then cleaned, dried and dressed  sterilely with Adaptic dressing, sponge and tape.  She was placed into a  knee immobilizer and brought to the recovery room in stable condition.      Madlyn Frankel Charlann Boxer, M.D.  Electronically Signed     MDO/MEDQ  D:  07/21/2005  T:  07/21/2005  Job:  [id number]

## 2010-08-22 NOTE — Discharge Summary (Signed)
Olivia Nixon, Olivia Nixon                 ACCOUNT NO.:  [id number]   MEDICAL RECORD NO.:  [id number]          PATIENT TYPE:  INP   LOCATION:  1619                         FACILITY:  [facility]   PHYSICIAN:  Madlyn Frankel. Charlann Boxer, M.D.  DATE OF BIRTH:  Nov 21, 1917   DATE OF ADMISSION:  07/21/2005  DATE OF DISCHARGE:  07/24/2005                                 DISCHARGE SUMMARY   ADMISSION DIAGNOSES:  1.  Severe osteoarthritis, both knees, worse in the left than the right.  2.  Coronary artery disease.  3.  History of melanoma of right arm.   DISCHARGE DIAGNOSES:  1.  Severe osteoarthritis, both knees, worse in the left than the right.  2.  Coronary artery disease.  3.  History of melanoma of right arm.   OPERATION:  On July 21, 2005, the patient underwent left total knee  replacement arthroplasty utilizing DePuy posterior stabilizing rotating  platform knee system, D.L. Idolina Primer assisting.   BRIEF HISTORY:  This 77 year old very active lady was seen for bilateral  knee pain. She developed end-stage changes into both knees. Anti-  inflammatories and injections did not help her knees at all. As she is a  very active lady she felt that her overall lifestyle was being compromised  by her knee pain and increasing inability to get about. After much  discussion including the risks and benefits of surgery, decisive benefit was  total knee replacement arthroplasty and after being cleared by Dr. Assunta Found she was scheduled for total knee replacement arthroplasty.   COURSE IN THE HOSPITAL:  The patient tolerated the surgical procedure quite  well. She did have some mild postoperative anemia which was not symptomatic.  She worked with physical therapy. She did have some very mild hyponatremia.  We switched her to juices and colas from her water and she had no further  problems. On the date of discharge she was awake, alert, oriented, and  ambulating in the hall, wound was dry, neurovascular in operative  extremity,  and it was felt that she could be rehabilitated in her home environment, and  arrangements were made for discharge. Home health was arranged.   Laboratory values in the hospital hematologically showed a preoperative CBC  with a mild anemia, hemoglobin 11.1, hematocrit 35.5. Final hemoglobin was  8.4 with a hematocrit of 25.6. Blood chemistry status essentially normal  other than a mild postoperative hyponatremia, but the final sodium was 134,  potassium 4.7. Urinalysis essentially negative for urinary tract infection.  EKG was normal sinus rhythm, possible atrial enlargement. No chest x-ray  seen on this chart.   CONDITION ON DISCHARGE:  Improved and stable.   PLAN:  The patient is discharged to her home. She is to return to see Dr.  Charlann Boxer in two weeks after the date of surgery, relaxant given as a muscle  relaxant, Coumadin per pharmacy, Vicodin for pain, and Trinsicon to build  the blood back up. She is to return to see Dr. Phillips Odor per his instructions.  Continue with physical therapy, weightbearing as tolerated.      Dooley L.  Cherlynn June.      Madlyn Frankel Charlann Boxer, M.D.  Electronically Signed    DLU/MEDQ  D:  07/28/2005  T:  07/29/2005  Job:  [id number]   cc:   Corrie Mckusick, M.D.  Fax: [phone]

## 2010-08-22 NOTE — H&P (Signed)
NAME:  Olivia Nixon, COCKERHAM                           ACCOUNT NO.:  [id number]   MEDICAL RECORD NO.:  [id number]                   PATIENT TYPE:  INP   LOCATION:  A314                                 FACILITY:  [facility]   PHYSICIAN:  Kirk Ruths, M.D.            DATE OF BIRTH:  Jul 20, 1917   DATE OF ADMISSION:  04/18/2003  DATE OF DISCHARGE:                                HISTORY & PHYSICAL   CHIEF COMPLAINT:  Cannot eat.   HISTORY OF PRESENT ILLNESS:  This is an elderly white female who was  originally seen in the office a week before with some cough and congestion.  Was treated for sinusitis with labyrinthitis.  Patient was also having some  nausea associated with the labyrinthitis and was treated with Aciphex.  She  is seen again on the day of admission with difficulty eating and complains  of lower abdominal pain and small, liquid bowel movements.  Her upper  respiratory symptoms and her labyrinthitis resolved.  She denies nausea or  vomiting.  Patient also appears very weak.  Flat and upright film in the  office shows a large amount of stool, consistent with obstipation.  White  count was 11,000, hemoglobin 13.5.  Sodium was 117.  Potassium 3.0, chloride  80.  BUN and creatinine are 14 and 1.0.   Patient is admitted for hyponatremia, hypokalemia, and obstipation.  Patient  does not remember the last time she had a full, normal bowel movement.  Every few days, she has just a very small, liquidy smear of a stool after a  glycerine suppository.   ALLERGIES:  1. IODINE.  2. __________.  3. CELEBREX.  4. ORUVAIL.  5. VOLTAREN.  6. SULFA.  7. DULCOLAX.   MEDICATIONS:  1. Chlorthalidone 12.5 mg daily.  2. Lopressor 25 b.i.d.  3. Aspirin 81 mg daily.  4. Bextra.  5. Fosamax.  6. Glucosamine.   Patient has a history of CABG four years ago, hypertension, hyperlipidemia.  Her cardiologist is  Dr. Dietrich Pates.   REVIEW OF SYSTEMS:  Denies nausea, vomiting, shortness of breath,  chest  pain, or diarrhea.   PHYSICAL EXAMINATION:  VITAL SIGNS:  Blood pressure 120/80.  She is  afebrile.  Pulse is 88 and regular.  GENERAL:  An elderly white female who appears weak.  HEENT:  TMs are normal.  Pupils are equal, round and reactive to light and  accommodation.  Oropharynx benign.  NECK:  Supple without JVD, bruit, or thyromegaly.  LUNGS:  Clear in all areas.  HEART:  Regular sinus rhythm without murmur, rub or gallop.  ABDOMEN:  Soft.  Slightly distended.  Mild tenderness to the lower abdomen  with no rebound or evidence of acute abdomen.  EXTREMITIES:  Without clubbing, cyanosis or edema.  NEUROLOGIC:  Grossly intact.   ASSESSMENT:  1. Hyponatremia.  2. Hypokalemia.  3. Constipation/obstipation.  4. History of coronary artery disease.  5.  History of hypertension.  6. History of hyperthyroidism.     ___________________________________________                                         Kirk Ruths, M.D.   WMM/MEDQ  D:  04/18/2003  T:  04/18/2003  Job:  [id number]

## 2010-08-22 NOTE — Discharge Summary (Signed)
NAME:  Olivia Nixon, Olivia Nixon                           ACCOUNT NO.:  [id number]   MEDICAL RECORD NO.:  [id number]                   PATIENT TYPE:  INP   LOCATION:  A318                                 FACILITY:  [facility]   PHYSICIAN:  Kirk Ruths, M.D.            DATE OF BIRTH:  10/11/1917   DATE OF ADMISSION:  04/18/2003  DATE OF DISCHARGE:  04/21/2003                                 DISCHARGE SUMMARY   FINAL DIAGNOSES:  1. Obstipation.  2. Hyponatremia.  3. Hypokalemia.  4. Urinary retention.  5. History of coronary artery disease.  6. History of hypertension.  7. History of hypothyroidism.   HOSPITAL COURSE:  This elderly white female was seen in the office the week  before with some congestion and was treated for sinusitis and labyrinthitis.  She was seen on day of admission complaining of difficulty eating, lower  abdominal pain, and small liquid bowel movements.  Flat and upright films  showed a large amount of stool consistent with obstipation.  Sodium was  found to be 117, a potassium of 3.0, with a BUN and creatinine of 14 and  1.0.  The patient was admitted for hydration and correction of her  obstipation and further evaluation.  The patient was seen in consultation by  Dr. Jena Gauss for GI who agreed the patient probably had obstipation.  MiraLax  was continued as well as enema.  She had some slight relief from enema.  Sodium began improving as well as her potassium with IV hydration.  The  patient also on CT was found to have a distended bladder and postvoid  residual showed 1100 mL with significant amelioration of her discomfort.  The patient began feeling much better, good appetite, normal BMs.  Sodium  had corrected to 131 at the time of discharge with potassium of 4.1  Urinalysis was negative.  Hemoglobin was 11.4.  The patient remained  afebrile throughout her stay.  She was discharged home on her previous  medications except her chlorthalidone was withheld.  She will be  followed  carefully as an outpatient.     ___________________________________________                                         Kirk Ruths, M.D.   WMM/MEDQ  D:  05/01/2003  T:  05/01/2003  Job:  [id number]

## 2010-08-22 NOTE — Procedures (Signed)
[facility]  Patient:    Olivia Nixon, Olivia Nixon                          MRN: [id number] Proc. Date: 09/03/00 Attending:  Hedwig Morton. Juanda Chance, M.D. [facility]                           Procedure Report  PROCEDURE:  Endoscopic retrograde cholangiopancreatography.  INDICATIONS:  This 77 year old white female has had an episode of abdominal pain which has since resolved. She also was found to have an abnormal ultrasound of the abdomen with question of a pancreatic mass. This was not confirmed on CT scan of the abdomen. She has had a weight loss of about 6 pounds but denied, at the time of exam, any abdominal pain. Her stool is Hemoccult negative. She is now undergoing ERCP to further evaluate the abnormality of the ultrasound as well as the abdominal pain. Serum amylase was normal, CA 19-9 markers are still pending.  ENDOSCOPE:  Olympus single channel side-viewing duodenoscope.  SEDATION:  Versed 7 mg IV, Demerol 60 mg IV.  FINDINGS:  Olympus single channel side-viewing duodenoscope passed blindly through the esophagus into the stomach. Gastric mucosa, body, and the antrum was unremarkable with normal pyloric outlet. CLO test was taken on the gastric antrum. Duodenal bulb appeared normal. Papilla was located without difficulty and showed normal size and configuration. There was a periampullary diverticulum next to it. The main pancreatic duct was cannulated without difficulty and it showed normal diameter in the head, body, and the tail with smooth tapering in the tail of the pancreas. There was no suggestion of pseudocyst or any irregularity that would suggest mass. Attempts to cannulate common bile duct were not successful. The patient tolerated the procedure well.  IMPRESSION: 1. Normal endoscopic retrograde pancreatography. 2. Periampullary diverticulum. 3. Status post CLO test.  PLAN: 1. Since there is no evidence of pancreatic abnormality, patient not likely to  have a pancreatic malignancy. We still have CA 19-9 tumor marker pending. 2. She is to continue on Aciphex 20 mg a day as needed. 3. We will await the results of the CLO test. 4. Because of the strong history of colon cancer in her brother as well as    father, she was recommended to undergo screening colonoscopy. DD:  09/03/00 TD:  09/03/00 Job: [id number]

## 2010-10-14 ENCOUNTER — Encounter: Payer: Self-pay | Admitting: Family Medicine

## 2010-12-01 ENCOUNTER — Ambulatory Visit (HOSPITAL_COMMUNITY)
Admission: RE | Admit: 2010-12-01 | Discharge: 2010-12-01 | Disposition: A | Discharge status: Home / Self Care | Payer: Medicare Other | Source: Ambulatory Visit | Attending: Family Medicine | Admitting: Family Medicine

## 2010-12-01 ENCOUNTER — Other Ambulatory Visit (HOSPITAL_COMMUNITY): Payer: Self-pay | Admitting: Family Medicine

## 2010-12-01 DIAGNOSIS — M899 Disorder of bone, unspecified: Secondary | ICD-10-CM | POA: Insufficient documentation

## 2010-12-01 DIAGNOSIS — M79609 Pain in unspecified limb: Secondary | ICD-10-CM

## 2010-12-01 DIAGNOSIS — M25559 Pain in unspecified hip: Secondary | ICD-10-CM | POA: Insufficient documentation

## 2010-12-01 DIAGNOSIS — M51379 Other intervertebral disc degeneration, lumbosacral region without mention of lumbar back pain or lower extremity pain: Secondary | ICD-10-CM | POA: Insufficient documentation

## 2010-12-01 DIAGNOSIS — M76899 Other specified enthesopathies of unspecified lower limb, excluding foot: Secondary | ICD-10-CM

## 2010-12-01 DIAGNOSIS — M5137 Other intervertebral disc degeneration, lumbosacral region: Secondary | ICD-10-CM | POA: Insufficient documentation

## 2010-12-22 ENCOUNTER — Encounter (HOSPITAL_COMMUNITY): Payer: Self-pay | Admitting: *Deleted

## 2010-12-22 ENCOUNTER — Emergency Department (HOSPITAL_COMMUNITY)
Admission: EM | Admit: 2010-12-22 | Discharge: 2010-12-22 | Disposition: A | Discharge status: Home / Self Care | Payer: Medicare Other | Attending: Emergency Medicine | Admitting: Emergency Medicine

## 2010-12-22 DIAGNOSIS — I2510 Atherosclerotic heart disease of native coronary artery without angina pectoris: Secondary | ICD-10-CM | POA: Insufficient documentation

## 2010-12-22 DIAGNOSIS — IMO0002 Reserved for concepts with insufficient information to code with codable children: Secondary | ICD-10-CM | POA: Insufficient documentation

## 2010-12-22 DIAGNOSIS — I10 Essential (primary) hypertension: Secondary | ICD-10-CM | POA: Insufficient documentation

## 2010-12-22 DIAGNOSIS — Z888 Allergy status to other drugs, medicaments and biological substances status: Secondary | ICD-10-CM | POA: Insufficient documentation

## 2010-12-22 DIAGNOSIS — Z951 Presence of aortocoronary bypass graft: Secondary | ICD-10-CM | POA: Insufficient documentation

## 2010-12-22 DIAGNOSIS — M1710 Unilateral primary osteoarthritis, unspecified knee: Secondary | ICD-10-CM | POA: Insufficient documentation

## 2010-12-22 DIAGNOSIS — E785 Hyperlipidemia, unspecified: Secondary | ICD-10-CM | POA: Insufficient documentation

## 2010-12-22 DIAGNOSIS — K5730 Diverticulosis of large intestine without perforation or abscess without bleeding: Secondary | ICD-10-CM | POA: Insufficient documentation

## 2010-12-22 DIAGNOSIS — H9190 Unspecified hearing loss, unspecified ear: Secondary | ICD-10-CM | POA: Insufficient documentation

## 2010-12-22 DIAGNOSIS — E039 Hypothyroidism, unspecified: Secondary | ICD-10-CM | POA: Insufficient documentation

## 2010-12-22 DIAGNOSIS — Z881 Allergy status to other antibiotic agents status: Secondary | ICD-10-CM | POA: Insufficient documentation

## 2010-12-22 DIAGNOSIS — T189XXA Foreign body of alimentary tract, part unspecified, initial encounter: Secondary | ICD-10-CM | POA: Insufficient documentation

## 2010-12-22 NOTE — ED Provider Notes (Signed)
History   Scribed for Benny Lennert, MD, the patient was seen in room APA03/APA03. This chart was scribed by Clarita Crane. This patient's care was started at 7:24PM.   CSN: [id number] Arrival date & time: 12/22/2010  6:09 PM   Chief Complaint  Patient presents with  . Foreign Body   HPI Olivia Nixon is a 77 y.o. female who presents to the Emergency Department complaining of foreign body in esophagus onset tonight while eating dinner which lasted 1-2 hours but is currently resolved. Patient reports she was eating a piece of steak when it became lodged in her throat. Family member notes patient began coughing which was productive of "phlegm" immediately after incident occurred. Denies SOB, dyspnea. Patient denies h/o esophageal dilation.  HPI ELEMENTS: Location: esophagus  Onset: tonight while eating dinner (steak) Duration: 1-2 hours but currently resolved  Timing: constant   Context:  as above  Associated symptoms: +cough productive of phlegm. Denies SOB and dyspnea.     PAST MEDICAL HISTORY:  Past Medical History  Diagnosis Date  . ASCVD (arteriosclerotic cardiovascular disease)     CABG surgery in 5/00  . Hypertension     Unspecified  . Hyperlipidemia   . Anemia, mild     Hemoglobin of 11.5-12  . Hearing loss   . 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 Diverticular disease   . DJD (degenerative joint disease)     Left knee    PAST SURGICAL HISTORY:  Past Surgical History  Procedure Date  . Coronary artery bypass graft 08/1998  . Tonsillectomy and adenoidectomy   . Total abdominal hysterectomy   . Melanoma excision     from right arm  . Cataract extraction     with lens implantation    FAMILY HISTORY:  No family history on file.   SOCIAL HISTORY: History   Social History  . Marital Status: Widowed    Spouse Name: N/A    Number of Children: N/A  . Years of Education: N/A   Occupational  History  . Retired    Social History Main Topics  . Smoking status: Never Smoker   . Smokeless tobacco: None  . Alcohol Use: No  . Drug Use: None  . Sexually Active: None   Other Topics Concern  . None   Social History Narrative  . None     Review of Systems  Constitutional: Negative for fatigue.  HENT: Negative for congestion, sinus pressure and ear discharge.        Foreign Body in esophagus.   Eyes: Negative for discharge.  Respiratory: Negative for cough and shortness of breath.   Cardiovascular: Negative for chest pain.  Gastrointestinal: Negative for abdominal pain and diarrhea.  Genitourinary: Negative for frequency and hematuria.  Musculoskeletal: Negative for back pain.  Skin: Negative for rash.  Neurological: Negative for seizures and headaches.  Hematological: Negative.   Psychiatric/Behavioral: Negative for hallucinations.    Allergies  Advil; Ambien; Amoxicillin; Celebrex; Chlorthalidone; Ciprofloxacin; Codeine; Darvocet; Dulcolax; Flagyl; Iodine; Methocarbamol; Mobic; Oruvail; Requip; Sulfa antibiotics; Valium; and Voltaren  Home Medications   Current Outpatient Rx  Name Route Sig Dispense Refill  . ACETAMINOPHEN 500 MG PO TABS Oral Take 1,000 mg by mouth 2 (two) times daily. For arthritis     . ASPIRIN 81 MG PO TABS Oral Take 81 mg by mouth daily.      Marland Kitchen LEVOCETIRIZINE DIHYDROCHLORIDE 5 MG PO TABS Oral  Take 5 mg by mouth every evening.      Marland Kitchen LEVOTHYROXINE SODIUM 50 MCG PO TABS Oral Take 50 mcg by mouth daily.      Marland Kitchen METOPROLOL TARTRATE 100 MG PO TABS Oral Take 100 mg by mouth 2 (two) times daily.      Marland Kitchen OLMESARTAN MEDOXOMIL 40 MG PO TABS Oral Take 40 mg by mouth daily.      Marland Kitchen PRESCRIPTION MEDICATION Nasal Place 1 spray into the nose daily.        Physical Exam    BP 182/83  Pulse 79  Temp 98.9 F (37.2 C)  Resp 20  Ht 5\' 3"  (1.6 m)  Wt 120 lb (54.432 kg)  BMI 21.26 kg/m2  SpO2 99%  Physical Exam  Nursing note and vitals  reviewed. Constitutional: She is oriented to person, place, and time. She appears well-developed and well-nourished. No distress.  HENT:  Head: Normocephalic and atraumatic.  Mouth/Throat: Oropharynx is clear and moist.  Eyes: Conjunctivae and EOM are normal. No scleral icterus.  Neck: Neck supple.  Cardiovascular: Normal rate.   Pulmonary/Chest: Effort normal. No stridor.  Abdominal: Soft. She exhibits no distension.  Musculoskeletal: Normal range of motion.  Neurological: She is alert and oriented to person, place, and time. Coordination normal.       Speech normal   Skin: Skin is warm and dry.  Psychiatric: She has a normal mood and affect. Her behavior is normal.    ED Course  Procedures  OTHER DATA REVIEWED: Nursing notes, vital signs, and past medical records reviewed. Lab results reviewed and considered Imaging results reviewed and considered  DIAGNOSTIC STUDIES:   LABS / RADIOLOGY: Results for orders placed in visit on 11/08/09  CONVERTED [facility] LAB      Component Value Range   TSH 2.657     Sodium 134     Potassium 4.9     Chloride 98     CO2 26     Glucose, Bld 93     BUN 12     Creatinine, Ser 0.75     Alkaline Phosphatase 95     AST 13     ALT 9     Total Protein 6.7     Albumin 4.1     Calcium 9.3     WBC 8.6     RBC 3.94     Hemoglobin 11.8     HCT 36.3     MCV 92.1     MCHC 32.5     RDW 15.2     Platelets 295     Lymphocytes Relative 23     Lymphs Abs 2.0     Monocytes Relative 7     Monocytes Absolute 0.6     Eosinophils Relative 2     Eosinophils Absolute 0.2     Basophils Relative 0     Basophils Absolute 0.0     No results found.  PROCEDURES:  ED COURSE / COORDINATION OF CARE: No orders of the defined types were placed in this encounter.   7:26PM- Patient able to swallow water with no difficulties. Informed of intent to d/c home. Patient agrees with plan set forth at this time.  MDM:   PLAN: Discharge Home The patient is to  return the emergency department if there is any worsening of symptoms. I have reviewed the discharge instructions with the patient/family  CONDITION ON DISCHARGE: Good.   MEDICATIONS GIVEN IN THE E.D.  Medications  PRESCRIPTION MEDICATION (not  administered)  acetaminophen (TYLENOL) 500 MG tablet (not administered)      The chart was scribed for me under my direct supervision.  I personally performed the history, physical, and medical decision making and all procedures in the evaluation of this patient.Benny Lennert, MD 12/22/10 [phone]

## 2010-12-22 NOTE — ED Notes (Signed)
States she choked on a piece of steak, still spitting up phelgm

## 2010-12-22 NOTE — ED Notes (Signed)
Pt states see feels better & is ready to go home. bp states she is still nervous at this time. States felt like steak got stuck near the top of her stomach & made her get sick.

## 2013-06-05 ENCOUNTER — Encounter (HOSPITAL_COMMUNITY): Payer: Self-pay | Admitting: Emergency Medicine

## 2013-06-05 ENCOUNTER — Emergency Department (HOSPITAL_COMMUNITY)
Admission: EM | Admit: 2013-06-05 | Discharge: 2013-06-05 | Disposition: A | Discharge status: Home / Self Care | Payer: Medicare Other | Source: Home / Self Care | Attending: Emergency Medicine | Admitting: Emergency Medicine

## 2013-06-05 ENCOUNTER — Emergency Department (HOSPITAL_COMMUNITY): Payer: Medicare Other

## 2013-06-05 DIAGNOSIS — W19XXXA Unspecified fall, initial encounter: Secondary | ICD-10-CM

## 2013-06-05 DIAGNOSIS — S0990XA Unspecified injury of head, initial encounter: Secondary | ICD-10-CM | POA: Insufficient documentation

## 2013-06-05 DIAGNOSIS — I2510 Atherosclerotic heart disease of native coronary artery without angina pectoris: Secondary | ICD-10-CM | POA: Insufficient documentation

## 2013-06-05 DIAGNOSIS — Y929 Unspecified place or not applicable: Secondary | ICD-10-CM | POA: Insufficient documentation

## 2013-06-05 DIAGNOSIS — Y9301 Activity, walking, marching and hiking: Secondary | ICD-10-CM | POA: Insufficient documentation

## 2013-06-05 DIAGNOSIS — Z8669 Personal history of other diseases of the nervous system and sense organs: Secondary | ICD-10-CM | POA: Insufficient documentation

## 2013-06-05 DIAGNOSIS — Z862 Personal history of diseases of the blood and blood-forming organs and certain disorders involving the immune mechanism: Secondary | ICD-10-CM | POA: Insufficient documentation

## 2013-06-05 DIAGNOSIS — E039 Hypothyroidism, unspecified: Secondary | ICD-10-CM | POA: Insufficient documentation

## 2013-06-05 DIAGNOSIS — I10 Essential (primary) hypertension: Secondary | ICD-10-CM | POA: Insufficient documentation

## 2013-06-05 DIAGNOSIS — Z7982 Long term (current) use of aspirin: Secondary | ICD-10-CM | POA: Insufficient documentation

## 2013-06-05 DIAGNOSIS — M1990 Unspecified osteoarthritis, unspecified site: Secondary | ICD-10-CM | POA: Insufficient documentation

## 2013-06-05 DIAGNOSIS — Z951 Presence of aortocoronary bypass graft: Secondary | ICD-10-CM | POA: Insufficient documentation

## 2013-06-05 DIAGNOSIS — W1809XA Striking against other object with subsequent fall, initial encounter: Secondary | ICD-10-CM | POA: Insufficient documentation

## 2013-06-05 DIAGNOSIS — Z79899 Other long term (current) drug therapy: Secondary | ICD-10-CM | POA: Insufficient documentation

## 2013-06-05 DIAGNOSIS — Z8719 Personal history of other diseases of the digestive system: Secondary | ICD-10-CM | POA: Insufficient documentation

## 2013-06-05 DIAGNOSIS — W010XXA Fall on same level from slipping, tripping and stumbling without subsequent striking against object, initial encounter: Secondary | ICD-10-CM | POA: Insufficient documentation

## 2013-06-05 NOTE — ED Provider Notes (Signed)
TIME SEEN: 9:03 PM  CHIEF COMPLAINT: Fall, head injury  HPI: Patient is a 80 y.o. F with history of coronary artery disease status post CABG, hypertension, hyperlipidemia, hypothyroidism, hearing loss uses a walker at baseline who presents to the emergency department with a mechanical fall.  Patient reports that she was walking to the bathroom with her walker slipped away from her and she fell striking her head.  There was no loss of consciousness.  She denies any chest pain, shortness of breath, dizziness or palpitations that led to her fall.  No new numbness, tingling or focal weakness.  She states she thinks she is on anticoagulation but cannot tell me what she takes.  She denies any current pain.  She does have a healing wound in her right posterior upper arm after she burned her arm with hot oatmeal.  No fevers or drainage from this wound.  ROS: See HPI Constitutional: no fever  Eyes: no drainage  ENT: no runny nose   Cardiovascular:  no chest pain  Resp: no SOB  GI: no vomiting GU: no dysuria Integumentary: no rash  Allergy: no hives  Musculoskeletal: no leg swelling  Neurological: no slurred speech ROS otherwise negative  PAST MEDICAL HISTORY/PAST SURGICAL HISTORY:  Past Medical History  Diagnosis Date  . ASCVD (arteriosclerotic cardiovascular disease)     CABG surgery in 5/00  . Hypertension     Unspecified  . Hyperlipidemia   . Anemia, mild     Hemoglobin of 11.5-12  . Hearing loss   . 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 Diverticular disease   . DJD (degenerative joint disease)     Left knee    MEDICATIONS:  Prior to Admission medications   Medication Sig Start Date End Date Taking? Authorizing Provider  acetaminophen (TYLENOL) 500 MG tablet Take 1,000 mg by mouth 2 (two) times daily. For arthritis     Historical Provider, MD  aspirin 81 MG tablet Take 81 mg by mouth daily.      Historical Provider, MD   levocetirizine (XYZAL) 5 MG tablet Take 5 mg by mouth every evening.      Historical Provider, MD  levothyroxine (SYNTHROID, LEVOTHROID) 50 MCG tablet Take 50 mcg by mouth daily.      Historical Provider, MD  metoprolol (LOPRESSOR) 100 MG tablet Take 100 mg by mouth 2 (two) times daily.      Historical Provider, MD  olmesartan (BENICAR) 40 MG tablet Take 40 mg by mouth daily.      Historical Provider, MD  PRESCRIPTION MEDICATION Place 1 spray into the nose daily.      Historical Provider, MD    ALLERGIES:  Allergies  Allergen Reactions  . Amoxicillin   . Bisacodyl   . Celebrex [Celecoxib]   . Chlorthalidone   . Ciprofloxacin   . Codeine   . Darvocet [Propoxyphene N-Acetaminophen]   . Diclofenac Sodium   . Flagyl [Metronidazole Hcl]   . Ibuprofen   . Iodine   . Meloxicam   . Methocarbamol   . Oruvail [Ketoprofen]   . Ropinirole Hcl   . Sulfa Antibiotics   . Valium   . Zolpidem Tartrate     SOCIAL HISTORY:  History  Substance Use Topics  . Smoking status: Never Smoker   . Smokeless tobacco: Not on file  . Alcohol Use: No    FAMILY HISTORY: No family history on file.  EXAM: There were  no vitals taken for this visit. CONSTITUTIONAL: Alert and oriented and responds appropriately to questions. Well-appearing; well-nourished; GCS 15, hard of hearing  HEAD: Normocephalic; atraumatic EYES: Conjunctivae clear, PERRL, EOMI ENT: normal nose; no rhinorrhea; moist mucous membranes; pharynx without lesions noted; no dental injury; no septal hematoma NECK: Supple, no meningismus, no LAD; no midline spinal tenderness, step-off or deformity CARD: RRR; S1 and S2 appreciated; no murmurs, no clicks, no rubs, no gallops RESP: Normal chest excursion without splinting or tachypnea; breath sounds clear and equal bilaterally; no wheezes, no rhonchi, no rales; chest wall stable, nontender to palpation ABD/GI: Normal bowel sounds; non-distended; soft, non-tender, no rebound, no  guarding PELVIS:  stable, nontender to palpation BACK:  The back appears normal and is non-tender to palpation, there is no CVA tenderness; no midline spinal tenderness, step-off or deformity EXT: Normal ROM in all joints; non-tender to palpation; no edema; normal capillary refill; no cyanosis    SKIN: Normal color for age and race; warm; 6 x 4 cm erythematous area that was likely a partial thickness burn that is now scabbed with no purulent drainage or fluctuance or induration to the posterior right upper arm  NEURO: Moves all extremities equally, sensation to light touch intact diffusely, cranial nerves II through XII intact  PSYCH: The patient's mood and manner are appropriate. Grooming and personal hygiene are appropriate.  MEDICAL DECISION MAKING:  Patient here after a mechanical fall.  She has no current complaints.  She is hemodynamically stable, neurologically intact.  Both he and CT imaging of her head and cervical spine.  If negative, will discharge home.  ED PROGRESS: CT scan of patient's head shows no intracranial abnormalities or skull fracture.  There are spondylitic changes 2 patient C5-C6 and C6-C7 vertebra with no prevertebral soft tissue swelling.  She has no neck pain at all on exam and is neurologically intact.  I am not concerned for ligamentous injury at this time I do not feel she needs an MRI.  We'll discharge him with return precautions, supportive care instructions.  Patient examined the table was understanding and are comfortable with plan.  Layla Maw Azhar Yogi, DO 06/05/13 [id number]

## 2013-06-05 NOTE — Discharge Instructions (Signed)
Fall Prevention and Home Safety °Falls cause injuries and can affect all age groups. It is possible to use preventive measures to significantly decrease the likelihood of falls. There are many simple measures which can make your home safer and prevent falls. °OUTDOORS °· Repair cracks and edges of walkways and driveways. °· Remove high doorway thresholds. °· Trim shrubbery on the main path into your home. °· Have good outside lighting. °· Clear walkways of tools, rocks, debris, and clutter. °· Check that handrails are not broken and are securely fastened. Both sides of steps should have handrails. °· Have leaves, snow, and ice cleared regularly. °· Use sand or salt on walkways during winter months. °· In the garage, clean up grease or oil spills. °BATHROOM °· Install night lights. °· Install grab bars by the toilet and in the tub and shower. °· Use non-skid mats or decals in the tub or shower. °· Place a plastic non-slip stool in the shower to sit on, if needed. °· Keep floors dry and clean up all water on the floor immediately. °· Remove soap buildup in the tub or shower on a regular basis. °· Secure bath mats with non-slip, double-sided rug tape. °· Remove throw rugs and tripping hazards from the floors. °BEDROOMS °· Install night lights. °· Make sure a bedside light is easy to reach. °· Do not use oversized bedding. °· Keep a telephone by your bedside. °· Have a firm chair with side arms to use for getting dressed. °· Remove throw rugs and tripping hazards from the floor. °KITCHEN °· Keep handles on pots and pans turned toward the center of the stove. Use back burners when possible. °· Clean up spills quickly and allow time for drying. °· Avoid walking on wet floors. °· Avoid hot utensils and knives. °· Position shelves so they are not too high or low. °· Place commonly used objects within easy reach. °· If necessary, use a sturdy step stool with a grab bar when reaching. °· Keep electrical cables out of the  way. °· Do not use floor polish or wax that makes floors slippery. If you must use wax, use non-skid floor wax. °· Remove throw rugs and tripping hazards from the floor. °STAIRWAYS °· Never leave objects on stairs. °· Place handrails on both sides of stairways and use them. Fix any loose handrails. Make sure handrails on both sides of the stairways are as long as the stairs. °· Check carpeting to make sure it is firmly attached along stairs. Make repairs to worn or loose carpet promptly. °· Avoid placing throw rugs at the top or bottom of stairways, or properly secure the rug with carpet tape to prevent slippage. Get rid of throw rugs, if possible. °· Have an electrician put in a light switch at the top and bottom of the stairs. °OTHER FALL PREVENTION TIPS °· Wear low-heel or rubber-soled shoes that are supportive and fit well. Wear closed toe shoes. °· When using a stepladder, make sure it is fully opened and both spreaders are firmly locked. Do not climb a closed stepladder. °· Add color or contrast paint or tape to grab bars and handrails in your home. Place contrasting color strips on first and last steps. °· Learn and use mobility aids as needed. Install an electrical emergency response system. °· Turn on lights to avoid dark areas. Replace light bulbs that burn out immediately. Get light switches that glow. °· Arrange furniture to create clear pathways. Keep furniture in the same place. °·   Firmly attach carpet with non-skid or double-sided tape. °· Eliminate uneven floor surfaces. °· Select a carpet pattern that does not visually hide the edge of steps. °· Be aware of all pets. °OTHER HOME SAFETY TIPS °· Set the water temperature for 120° F (48.8° C). °· Keep emergency numbers on or near the telephone. °· Keep smoke detectors on every level of the home and near sleeping areas. °Document Released: 03/13/2002 Document Revised: 09/22/2011 Document Reviewed: 06/12/2011 °ExitCare® Patient Information ©2014  [employer]. ° °Head Injury, Adult °You have received a head injury. It does not appear serious at this time. Headaches and vomiting are common following head injury. It should be easy to awaken from sleeping. Sometimes it is necessary for you to stay in the emergency department for a while for observation. Sometimes admission to the hospital may be needed. After injuries such as yours, most problems occur within the first 24 hours, but side effects may occur up to 7 10 days after the injury. It is important for you to carefully monitor your condition and contact your health care provider or seek immediate medical care if there is a change in your condition. °WHAT ARE THE TYPES OF HEAD INJURIES? °Head injuries can be as minor as a bump. Some head injuries can be more severe. More severe head injuries include: °· A jarring injury to the brain (concussion). °· A bruise of the brain (contusion). This mean there is bleeding in the brain that can cause swelling. °· A cracked skull (skull fracture). °· Bleeding in the brain that collects, clots, and forms a bump (hematoma). °WHAT CAUSES A HEAD INJURY? °A serious head injury is most likely to happen to someone who is in a car wreck and is not wearing a seat belt. Other causes of major head injuries include bicycle or motorcycle accidents, sports injuries, and falls. °HOW ARE HEAD INJURIES DIAGNOSED? °A complete history of the event leading to the injury and your current symptoms will be helpful in diagnosing head injuries. Many times, pictures of the brain, such as CT or MRI are needed to see the extent of the injury. Often, an overnight hospital stay is necessary for observation.  °WHEN SHOULD I SEEK IMMEDIATE MEDICAL CARE?  °You should get help right away if: °· You have confusion or drowsiness. °· You feel sick to your stomach (nauseous) or have continued, forceful vomiting. °· You have dizziness or unsteadiness that is getting worse. °· You have severe, continued  headaches not relieved by medicine. Only take over-the-counter or prescription medicines for pain, fever, or discomfort as directed by your health care provider. °· You do not have normal function of the arms or legs or are unable to walk. °· You notice changes in the black spots in the center of the colored part of your eye (pupil). °· You have a clear or bloody fluid coming from your nose or ears. °· You have a loss of vision. °During the next 24 hours after the injury, you must stay with someone who can watch you for the warning signs. This person should contact local emergency services (911 in the [place] if you have seizures, you become unconscious, or you are unable to wake up. °HOW CAN I PREVENT A HEAD INJURY IN THE FUTURE? °The most important factor for preventing major head injuries is avoiding motor vehicle accidents.  To minimize the potential for damage to your head, it is crucial to wear seat belts while riding in motor vehicles. Wearing helmets while bike riding   and playing collision sports (like football) is also helpful. Also, avoiding dangerous activities around the house will further help reduce your risk of head injury.  °WHEN CAN I RETURN TO NORMAL ACTIVITIES AND ATHLETICS? °You should be reevaluated by your health care provider before returning to these activities. If you have any of the following symptoms, you should not return to activities or contact sports until 1 week after the symptoms have stopped: °· Persistent headache. °· Dizziness or vertigo. °· Poor attention and concentration. °· Confusion. °· Memory problems. °· Nausea or vomiting. °· Fatigue or tire easily. °· Irritability. °· Intolerant of bright lights or loud noises. °· Anxiety or depression. °· Disturbed sleep. °MAKE SURE YOU:  °· Understand these instructions. °· Will watch your condition. °· Will get help right away if you are not doing well or get worse. °Document Released: 03/23/2005 Document Revised: 01/11/2013 Document  Reviewed: 11/28/2012 °ExitCare® Patient Information ©2014 [employer]. ° °

## 2013-06-05 NOTE — ED Notes (Signed)
Pt states she fell in the hall of her home and crawled to bed and climbed on bed. Pt has knot on forehead and old burn to the rt arm from cooking last week.

## 2013-06-06 ENCOUNTER — Emergency Department (HOSPITAL_COMMUNITY): Payer: Medicare Other

## 2013-06-06 ENCOUNTER — Inpatient Hospital Stay (HOSPITAL_COMMUNITY)
Admission: EM | Admit: 2013-06-06 | Discharge: 2013-06-10 | DRG: 690 | Disposition: A | Discharge status: Skilled Nursing Facility | Payer: Medicare Other | Attending: Internal Medicine | Admitting: Internal Medicine

## 2013-06-06 ENCOUNTER — Encounter (HOSPITAL_COMMUNITY): Payer: Self-pay | Admitting: Emergency Medicine

## 2013-06-06 DIAGNOSIS — N390 Urinary tract infection, site not specified: Principal | ICD-10-CM | POA: Diagnosis present

## 2013-06-06 DIAGNOSIS — I2510 Atherosclerotic heart disease of native coronary artery without angina pectoris: Secondary | ICD-10-CM | POA: Diagnosis present

## 2013-06-06 DIAGNOSIS — E871 Hypo-osmolality and hyponatremia: Secondary | ICD-10-CM | POA: Diagnosis present

## 2013-06-06 DIAGNOSIS — E785 Hyperlipidemia, unspecified: Secondary | ICD-10-CM | POA: Diagnosis present

## 2013-06-06 DIAGNOSIS — K5730 Diverticulosis of large intestine without perforation or abscess without bleeding: Secondary | ICD-10-CM

## 2013-06-06 DIAGNOSIS — R531 Weakness: Secondary | ICD-10-CM | POA: Diagnosis present

## 2013-06-06 DIAGNOSIS — M1710 Unilateral primary osteoarthritis, unspecified knee: Secondary | ICD-10-CM | POA: Diagnosis present

## 2013-06-06 DIAGNOSIS — Z9849 Cataract extraction status, unspecified eye: Secondary | ICD-10-CM

## 2013-06-06 DIAGNOSIS — S2239XA Fracture of one rib, unspecified side, initial encounter for closed fracture: Secondary | ICD-10-CM

## 2013-06-06 DIAGNOSIS — Z85820 Personal history of malignant melanoma of skin: Secondary | ICD-10-CM

## 2013-06-06 DIAGNOSIS — H9190 Unspecified hearing loss, unspecified ear: Secondary | ICD-10-CM | POA: Diagnosis present

## 2013-06-06 DIAGNOSIS — I10 Essential (primary) hypertension: Secondary | ICD-10-CM | POA: Diagnosis present

## 2013-06-06 DIAGNOSIS — R5381 Other malaise: Secondary | ICD-10-CM | POA: Diagnosis present

## 2013-06-06 DIAGNOSIS — Z7982 Long term (current) use of aspirin: Secondary | ICD-10-CM

## 2013-06-06 DIAGNOSIS — E039 Hypothyroidism, unspecified: Secondary | ICD-10-CM | POA: Diagnosis present

## 2013-06-06 DIAGNOSIS — Z79899 Other long term (current) drug therapy: Secondary | ICD-10-CM

## 2013-06-06 DIAGNOSIS — Z951 Presence of aortocoronary bypass graft: Secondary | ICD-10-CM

## 2013-06-06 DIAGNOSIS — Z9181 History of falling: Secondary | ICD-10-CM

## 2013-06-06 DIAGNOSIS — F0390 Unspecified dementia without behavioral disturbance: Secondary | ICD-10-CM | POA: Diagnosis present

## 2013-06-06 DIAGNOSIS — D649 Anemia, unspecified: Secondary | ICD-10-CM

## 2013-06-06 DIAGNOSIS — IMO0002 Reserved for concepts with insufficient information to code with codable children: Secondary | ICD-10-CM | POA: Diagnosis present

## 2013-06-06 LAB — I-STAT CHEM 8, ED
BUN: 14 mg/dL (ref 6–23)
CALCIUM ION: 1.06 mmol/L — AB (ref 1.13–1.30)
CHLORIDE: 90 meq/L — AB (ref 96–112)
CREATININE: 0.7 mg/dL (ref 0.50–1.10)
Glucose, Bld: 106 mg/dL — ABNORMAL HIGH (ref 70–99)
HCT: 41 % (ref 36.0–46.0)
Hemoglobin: 13.9 g/dL (ref 12.0–15.0)
POTASSIUM: 3.7 meq/L (ref 3.7–5.3)
Sodium: 127 mEq/L — ABNORMAL LOW (ref 137–147)
TCO2: 25 mmol/L (ref 0–100)

## 2013-06-06 LAB — BASIC METABOLIC PANEL
BUN: 13 mg/dL (ref 6–23)
CO2: 23 meq/L (ref 19–32)
Calcium: 8.8 mg/dL (ref 8.4–10.5)
Chloride: 89 mEq/L — ABNORMAL LOW (ref 96–112)
Creatinine, Ser: 0.62 mg/dL (ref 0.50–1.10)
GFR calc Af Amer: 87 mL/min — ABNORMAL LOW (ref >90)
GFR calc non Af Amer: 75 mL/min — ABNORMAL LOW (ref >90)
Glucose, Bld: 103 mg/dL — ABNORMAL HIGH (ref 70–99)
POTASSIUM: 3.9 meq/L (ref 3.7–5.3)
SODIUM: 128 meq/L — AB (ref 137–147)

## 2013-06-06 LAB — CBC
HCT: 37.6 % (ref 36.0–46.0)
HEMOGLOBIN: 13.1 g/dL (ref 12.0–15.0)
MCH: 31.1 pg (ref 26.0–34.0)
MCHC: 34.8 g/dL (ref 30.0–36.0)
MCV: 89.3 fL (ref 78.0–100.0)
Platelets: 197 10*3/uL (ref 150–400)
RBC: 4.21 MIL/uL (ref 3.87–5.11)
RDW: 15.6 % — ABNORMAL HIGH (ref 11.5–15.5)
WBC: 6.5 10*3/uL (ref 4.0–10.5)

## 2013-06-06 LAB — CK: Total CK: 430 U/L — ABNORMAL HIGH (ref 7–177)

## 2013-06-06 LAB — TROPONIN I

## 2013-06-06 MED ORDER — SODIUM CHLORIDE 0.9 % IV BOLUS (SEPSIS)
1000.0000 mL | Freq: Once | INTRAVENOUS | Status: AC
  Administered 2013-06-06: 1000 mL via INTRAVENOUS

## 2013-06-06 MED ORDER — ONDANSETRON 4 MG PO TBDP
4.0000 mg | ORAL_TABLET | Freq: Once | ORAL | Status: AC
  Administered 2013-06-06: 4 mg via ORAL
  Filled 2013-06-06: qty 1

## 2013-06-06 MED ORDER — HYDROCODONE-ACETAMINOPHEN 5-325 MG PO TABS
1.0000 | ORAL_TABLET | Freq: Once | ORAL | Status: AC
  Administered 2013-06-06: 1 via ORAL
  Filled 2013-06-06: qty 1

## 2013-06-06 NOTE — ED Notes (Signed)
Patient here from her PMD to be seen for multiple reasons.  Family states that she fell yesterday, burnt her upper right arm approx 3 weeks ago and wants that to be checked.  Family states she has not eaten anything for the past day and a half and is having trouble with gas.

## 2013-06-06 NOTE — ED Provider Notes (Signed)
TIME SEEN: 9:53 PM  CHIEF COMPLAINT: Generalized weakness, unable to walk  HPI: Patient is a 80 year old female with a history of coronary artery disease status post CABG, hypertension, hyperlipidemia, hypothyroidism, hearing loss who presents to the emergency department with her family who is concerned that she has been unable to care for herself and walk today. Patient lives alone uses a walker at baseline. She was seen in the emergency department last night after she had a mechanical fall. Family was concerned about her symptoms and took her to the primary care physician's office today. They were concerned for possible dehydration and the need for physical therapy consult, basic labs, chest x-ray and urine to rule out infection. Patient is complaining of right-sided chest wall pain. No headache. No focal numbness or weakness. No vomiting or diarrhea. No dysuria hematuria. No shortness of breath.   ROS: See HPI Constitutional: no fever  Eyes: no drainage  ENT: no runny nose   Cardiovascular:  Right-sided chest pain  Resp: no SOB  GI: no vomiting GU: no dysuria Integumentary: no rash  Allergy: no hives  Musculoskeletal: no leg swelling  Neurological: no slurred speech ROS otherwise negative  PAST MEDICAL HISTORY/PAST SURGICAL HISTORY:  Past Medical History  Diagnosis Date  . ASCVD (arteriosclerotic cardiovascular disease)     CABG surgery in 5/00  . Hypertension     Unspecified  . Hyperlipidemia   . Anemia, mild     Hemoglobin of 11.5-12  . Hearing loss   . 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 Diverticular disease   . DJD (degenerative joint disease)     Left knee    MEDICATIONS:  Prior to Admission medications   Medication Sig Start Date End Date Taking? Authorizing Provider  acetaminophen (TYLENOL ARTHRITIS PAIN) 650 MG CR tablet Take 650 mg by mouth every morning.   Yes Historical Provider, MD  aspirin 81 MG  tablet Take 81 mg by mouth daily.     Yes Historical Provider, MD  fluticasone (FLONASE) 50 MCG/ACT nasal spray Place 1 spray into both nostrils daily. 05/10/13  Yes Historical Provider, MD  levocetirizine (XYZAL) 5 MG tablet Take 5 mg by mouth daily.    Yes Historical Provider, MD  levothyroxine (SYNTHROID, LEVOTHROID) 50 MCG tablet Take 50 mcg by mouth daily.     Yes Historical Provider, MD  metoprolol (LOPRESSOR) 100 MG tablet Take 100 mg by mouth 2 (two) times daily.     Yes Historical Provider, MD  olmesartan (BENICAR) 40 MG tablet Take 40 mg by mouth daily.     Yes Historical Provider, MD    ALLERGIES:  Allergies  Allergen Reactions  . Amoxicillin   . Bisacodyl   . Celebrex [Celecoxib]   . Chlorthalidone   . Ciprofloxacin   . Codeine   . Darvocet [Propoxyphene N-Acetaminophen]   . Diclofenac Sodium   . Flagyl [Metronidazole Hcl]   . Ibuprofen   . Iodine   . Meloxicam   . Methocarbamol   . Oruvail [Ketoprofen]   . Ropinirole Hcl   . Sulfa Antibiotics   . Valium   . Zolpidem Tartrate     SOCIAL HISTORY:  History  Substance Use Topics  . Smoking status: Never Smoker   . Smokeless tobacco: Not on file  . Alcohol Use: No    FAMILY HISTORY: History reviewed. No pertinent family history.  EXAM: BP 137/64  Pulse 88  Temp(Src) 98.7 F (  37.1 C) (Oral)  Resp 18  Ht 5' (1.524 m)  Wt 115 lb (52.164 kg)  BMI 22.46 kg/m2  SpO2 96% CONSTITUTIONAL: Alert and oriented and responds appropriately to questions. Well-appearing; well-nourished, elderly, hard of hearing HEAD: Normocephalic EYES: Conjunctivae clear, PERRL ENT: normal nose; no rhinorrhea; moist mucous membranes; pharynx without lesions noted NECK: Supple, no meningismus, no LAD  CARD: RRR; S1 and S2 appreciated; no murmurs, no clicks, no rubs, no gallops RESP: Normal chest excursion without splinting or tachypnea; breath sounds clear and equal bilaterally; no wheezes, no rhonchi, no rales, patient is tender to  palpation over her right chest wall without crepitus or ecchymosis or deformity ABD/GI: Normal bowel sounds; non-distended; soft, non-tender, no rebound, no guarding BACK:  The back appears normal and is non-tender to palpation, there is no CVA tenderness EXT: Normal ROM in all joints; non-tender to palpation; no edema; normal capillary refill; no cyanosis    SKIN: Normal color for age and race; warm; mildly erythematous, scabbed lesion to her right upper posterior extremity with no purulent drainage or induration or warmth or fluctuance, this area is nontender to palpation, no blisters NEURO: Moves all extremities equally, strength 5/5 in all 4 extremities, sensation to light touch intact diffusely, cranial nerves II through XII intact PSYCH: The patient's mood and manner are appropriate. Grooming and personal hygiene are appropriate.  MEDICAL DECISION MAKING: Patient here with generalized weakness and difficulty walking. She was seen last in the emergency department after a mechanical fall and was able to angulate afterwords. She is neurologically intact. Head and cervical spine CT at that time were negative. Today family reports she has not been walking or carrying for herself normally. They report she has not been eating or drinking well. Labs show mild hyponatremia. EKG shows no new ischemic changes. Chest x-ray shows a right eighth rib fracture with no pneumothorax or infiltrate. Repeat head CT shows no intracranial hemorrhage or infarct. She has no focal neurologic deficit on exam. Given IV fluids and Vicodin. Urine pending. We'll attempt to ambulate patient. If she is unable to walk, patient may need admission to the hospital.  ED PROGRESS: Patient's urine shows small leukocytes, bacteria but also squamous cells. Is a catheterized specimen. Urine culture pending. We'll attempt to ambulate patient using a walker.  12:21 AM Pt is unable to ambulate, drags right leg behind her.  Will admit as pt is  unable to walk, pivot.  D/w hospitalist Julian ReilGardner to see in ED as pt will need IVF hydration, abx, PT.   PCP Dr. Phillips OdorGolding.    EKG Interpretation  Date/Time:  Tuesday June 06 2013 19:29:09 EST Ventricular Rate:  97 PR Interval:  156 QRS Duration: 84 QT Interval:  386 QTC Calculation: 490 R Axis:   -68 Text Interpretation:  Sinus rhythm with Premature atrial complexes Possible Left atrial enlargement Left axis deviation Nonspecific T wave abnormality Prolonged QT Abnormal ECG Confirmed by Boy Delamater,  DO, Imanni Burdine ([id number]) on 06/06/2013 9:54:53 PM        Layla MawKristen N Harolyn Cocker, DO 06/07/13 [id number]

## 2013-06-06 NOTE — ED Notes (Signed)
MD at bedside. 

## 2013-06-07 DIAGNOSIS — R5383 Other fatigue: Secondary | ICD-10-CM

## 2013-06-07 DIAGNOSIS — E871 Hypo-osmolality and hyponatremia: Secondary | ICD-10-CM

## 2013-06-07 DIAGNOSIS — R531 Weakness: Secondary | ICD-10-CM | POA: Diagnosis present

## 2013-06-07 DIAGNOSIS — S2239XA Fracture of one rib, unspecified side, initial encounter for closed fracture: Secondary | ICD-10-CM

## 2013-06-07 DIAGNOSIS — R5381 Other malaise: Secondary | ICD-10-CM

## 2013-06-07 LAB — URINALYSIS, ROUTINE W REFLEX MICROSCOPIC
BILIRUBIN URINE: NEGATIVE
GLUCOSE, UA: NEGATIVE mg/dL
HGB URINE DIPSTICK: NEGATIVE
KETONES UR: NEGATIVE mg/dL
NITRITE: NEGATIVE
PH: 7.5 (ref 5.0–8.0)
Protein, ur: 30 mg/dL — AB
SPECIFIC GRAVITY, URINE: 1.017 (ref 1.005–1.030)
Urobilinogen, UA: 1 mg/dL (ref 0.0–1.0)

## 2013-06-07 LAB — CBG MONITORING, ED: GLUCOSE-CAPILLARY: 97 mg/dL (ref 70–99)

## 2013-06-07 LAB — URINE MICROSCOPIC-ADD ON

## 2013-06-07 LAB — BASIC METABOLIC PANEL
BUN: 12 mg/dL (ref 6–23)
CHLORIDE: 93 meq/L — AB (ref 96–112)
CO2: 22 mEq/L (ref 19–32)
CREATININE: 0.59 mg/dL (ref 0.50–1.10)
Calcium: 8.3 mg/dL — ABNORMAL LOW (ref 8.4–10.5)
GFR calc non Af Amer: 77 mL/min — ABNORMAL LOW (ref >90)
GFR, EST AFRICAN AMERICAN: 89 mL/min — AB (ref >90)
Glucose, Bld: 98 mg/dL (ref 70–99)
POTASSIUM: 4.2 meq/L (ref 3.7–5.3)
Sodium: 128 mEq/L — ABNORMAL LOW (ref 137–147)

## 2013-06-07 MED ORDER — LEVOTHYROXINE SODIUM 50 MCG PO TABS
50.0000 ug | ORAL_TABLET | Freq: Every day | ORAL | Status: DC
  Administered 2013-06-07 – 2013-06-10 (×4): 50 ug via ORAL
  Filled 2013-06-07 (×5): qty 1

## 2013-06-07 MED ORDER — FLUTICASONE PROPIONATE 50 MCG/ACT NA SUSP
1.0000 | Freq: Every day | NASAL | Status: DC
  Administered 2013-06-07 – 2013-06-10 (×4): 1 via NASAL
  Filled 2013-06-07: qty 16

## 2013-06-07 MED ORDER — ACETAMINOPHEN 325 MG PO TABS
650.0000 mg | ORAL_TABLET | Freq: Every day | ORAL | Status: DC
  Administered 2013-06-07 – 2013-06-10 (×4): 650 mg via ORAL
  Filled 2013-06-07 (×4): qty 2

## 2013-06-07 MED ORDER — ACETAMINOPHEN ER 650 MG PO TBCR: 650.0000 mg | EXTENDED_RELEASE_TABLET | Freq: Every morning | ORAL | Status: DC

## 2013-06-07 MED ORDER — ASPIRIN 81 MG PO TABS: 81.0000 mg | ORAL_TABLET | Freq: Every day | ORAL | Status: DC

## 2013-06-07 MED ORDER — IRBESARTAN 300 MG PO TABS
300.0000 mg | ORAL_TABLET | Freq: Every day | ORAL | Status: DC
  Administered 2013-06-07 – 2013-06-10 (×4): 300 mg via ORAL
  Filled 2013-06-07 (×4): qty 1

## 2013-06-07 MED ORDER — SODIUM CHLORIDE 0.9 % IV SOLN
INTRAVENOUS | Status: DC
  Administered 2013-06-07: 500 mL via INTRAVENOUS
  Administered 2013-06-07: 80 mL/h via INTRAVENOUS
  Administered 2013-06-07 – 2013-06-09 (×5): via INTRAVENOUS

## 2013-06-07 MED ORDER — HYDROCODONE-ACETAMINOPHEN 5-325 MG PO TABS
1.0000 | ORAL_TABLET | Freq: Four times a day (QID) | ORAL | Status: DC | PRN
  Administered 2013-06-07 – 2013-06-10 (×6): 1 via ORAL
  Filled 2013-06-07 (×4): qty 1
  Filled 2013-06-07: qty 2
  Filled 2013-06-07 (×2): qty 1

## 2013-06-07 MED ORDER — ENSURE COMPLETE PO LIQD
237.0000 mL | Freq: Two times a day (BID) | ORAL | Status: DC
  Administered 2013-06-07 – 2013-06-10 (×6): 237 mL via ORAL

## 2013-06-07 MED ORDER — LORATADINE 10 MG PO TABS
10.0000 mg | ORAL_TABLET | Freq: Every day | ORAL | Status: DC
  Administered 2013-06-07 – 2013-06-10 (×4): 10 mg via ORAL
  Filled 2013-06-07 (×4): qty 1

## 2013-06-07 MED ORDER — ASPIRIN EC 81 MG PO TBEC
81.0000 mg | DELAYED_RELEASE_TABLET | Freq: Every day | ORAL | Status: DC
  Administered 2013-06-07 – 2013-06-10 (×4): 81 mg via ORAL
  Filled 2013-06-07 (×4): qty 1

## 2013-06-07 MED ORDER — METOPROLOL TARTRATE 100 MG PO TABS
100.0000 mg | ORAL_TABLET | Freq: Two times a day (BID) | ORAL | Status: DC
  Administered 2013-06-07 – 2013-06-10 (×8): 100 mg via ORAL
  Filled 2013-06-07 (×10): qty 1

## 2013-06-07 MED ORDER — SIMETHICONE 80 MG PO CHEW
160.0000 mg | CHEWABLE_TABLET | Freq: Four times a day (QID) | ORAL | Status: DC | PRN
  Administered 2013-06-07 – 2013-06-08 (×6): 160 mg via ORAL
  Filled 2013-06-07 (×6): qty 2

## 2013-06-07 MED ORDER — CEFTRIAXONE SODIUM 1 G IJ SOLR
1.0000 g | Freq: Once | INTRAMUSCULAR | Status: AC
  Administered 2013-06-07: 1 g via INTRAVENOUS
  Filled 2013-06-07: qty 10

## 2013-06-07 MED ORDER — DEXTROSE 5 % IV SOLN
1.0000 g | INTRAVENOUS | Status: DC
  Administered 2013-06-07 – 2013-06-09 (×3): 1 g via INTRAVENOUS
  Filled 2013-06-07 (×4): qty 10

## 2013-06-07 MED ORDER — HEPARIN SODIUM (PORCINE) 5000 UNIT/ML IJ SOLN
5000.0000 [IU] | Freq: Three times a day (TID) | INTRAMUSCULAR | Status: DC
  Administered 2013-06-07 – 2013-06-08 (×4): 5000 [IU] via SUBCUTANEOUS
  Filled 2013-06-07 (×7): qty 1

## 2013-06-07 MED ORDER — LEVOCETIRIZINE DIHYDROCHLORIDE 5 MG PO TABS: 5.0000 mg | ORAL_TABLET | Freq: Every day | ORAL | Status: DC

## 2013-06-07 NOTE — Progress Notes (Signed)
Pt c/o gas and says she hasn't had a BM in a while, wanting something for constipation. MD notified and awaiting orders.

## 2013-06-07 NOTE — Clinical Social Work Placement (Signed)
Clinical Social Work Department CLINICAL SOCIAL WORK PLACEMENT NOTE 06/07/2013  Patient:  Olivia Nixon,Olivia Nixon  Account Number:  [id number] Admit date:  06/06/2013  Clinical Social Worker:  Cherre BlancJOSEPH BRYANT Bianka Liberati, [place]  Date/time:  06/07/2013 01:00 PM  Clinical Social Work is seeking post-discharge placement for this patient at the following level of care:   SKILLED NURSING   (*CSW will update this form in Epic as items are completed)   06/07/2013  [facility]/family provided with [facility] of Clinical Social Work's list of facilities offering this level of care within the geographic area requested by the patient (or if unable, by the patient's family).  06/07/2013  Patient/family informed of their freedom to choose among providers that offer the needed level of care, that participate in Medicare, Medicaid or managed care program needed by the patient, have an available bed and are willing to accept the patient.  06/07/2013  Patient/family informed of [facility]' ownership interest in [facility], as well as of the fact that they are under no obligation to receive care at this facility.  PASARR submitted to EDS on 06/07/2013 PASARR number received from EDS on 06/07/2013  FL2 transmitted to all facilities in geographic area requested by pt/family on  06/07/2013 FL2 transmitted to all facilities within larger geographic area on   Patient informed that his/her managed care company has contracts with or will negotiate with  certain facilities, including the following:     Patient/family informed of bed offers received:   Patient chooses bed at  Physician recommends and patient chooses bed at    Patient to be transferred to  on   Patient to be transferred to facility by   The following physician request were entered in Epic:   Additional Comments:    Roddie McBryant Naiya Corral, [place], [place], [phone]

## 2013-06-07 NOTE — Progress Notes (Signed)
Received pt report from CarlosMonique, RN in the ED.

## 2013-06-07 NOTE — Progress Notes (Signed)
UR completed. Patient changed to inpatient- requiring IVF @ 75cc/hr and IV antibiotics.  

## 2013-06-07 NOTE — Progress Notes (Signed)
INITIAL NUTRITION ASSESSMENT  DOCUMENTATION CODES Per approved criteria  -Not Applicable   INTERVENTION: Recommend diet liberalization to Regular diet to maximize po intake. Add Ensure Complete po BID, each supplement provides 350 kcal and 13 grams of protein. RD to continue to follow nutrition care plan.  NUTRITION DIAGNOSIS: Inadequate oral intake related to poor appetite, dementia, and difficulty feeding self as evidenced by family report and limited PO intake.   Goal: Intake to meet >90% of estimated nutrition needs.  Monitor:  weight trends, lab trends, I/O's, PO intake, supplement tolerance  Reason for Assessment: Malnutrition Screening Tool  80 y.o. female  Admitting Dx: Generalized weakness  ASSESSMENT: PMHx significant for HTN, HLD, hearing loss. Admitted with progressive mental decline and weakness since September of last year.   Family feels as though pt is no longer safe to live at home, has recurring falls and burn wounds on arms from cooking. Patient with progressive dementia.  Currently ordered for a Heart Healthy diet - ate at most 25% of breakfast this morning. Sister at bedside providing most of nutrition hx. Pt used to be able to cook for herself, drive to get groceries, etc, however over the past 2 weeks this has significantly decreased. Pt in denial about her decreased weakness and ability to care for self. Pt now limited to prepared/processed foods, such as canned foods, cookies, etc as she has difficulty to manage ADL's. Pt very HOH per family. Pt does not require soft textures. Has Ensure in her fridge at home, sister agreeable to pt receiving while here. Family reports that her usual weight is 110 - 115 lb, this is consistent with current weight. Pt with diminished fat/muscle mass, however suspect that this is related to advanced age. Pt is at nutrition risk 2/2 progressive mental decline.   Height: Ht Readings from Last 1 Encounters:  06/07/13 5\' 3"  (1.6  m)    Weight: Wt Readings from Last 1 Encounters:  06/06/13 115 lb (52.164 kg)    Ideal Body Weight: 115 lb  % Ideal Body Weight: 100%  Wt Readings from Last 10 Encounters:  06/06/13 115 lb (52.164 kg)  06/05/13 115 lb (52.164 kg)  12/22/10 120 lb (54.432 kg)  12/06/09 122 lb (55.339 kg)  11/07/08 123 lb (55.792 kg)    Usual Body Weight: 115 lb  % Usual Body Weight: 100%  BMI:  Body mass index is 20.38 kg/(m^2). Normal weight  Estimated Nutritional Needs: Kcal: 1200 - 1500 Protein: 55 - 65 g Fluid: at least 1.5 liters daily  Skin: intact; R upper arm scabs from previous burn  Diet Order: Cardiac  EDUCATION NEEDS: -No education needs identified at this time  No intake or output data in the 24 hours ending 06/07/13 1109  Last BM: PTA  Labs:   Recent Labs Lab 06/06/13 1922 06/06/13 2021 06/07/13 0544  NA 128* 127* 128*  K 3.9 3.7 4.2  CL 89* 90* 93*  CO2 23  --  22  BUN 13 14 12   CREATININE 0.62 0.70 0.59  CALCIUM 8.8  --  8.3*  GLUCOSE 103* 106* 98    CBG (last 3)   Recent Labs  06/07/13 0128  GLUCAP 97    Scheduled Meds: . acetaminophen  650 mg Oral Daily  . aspirin EC  81 mg Oral Daily  . cefTRIAXone (ROCEPHIN)  IV  1 g Intravenous Q24H  . fluticasone  1 spray Each Nare Daily  . heparin  5,000 Units Subcutaneous 3 times per day  .  irbesartan  300 mg Oral Daily  . levothyroxine  50 mcg Oral QAC breakfast  . loratadine  10 mg Oral Daily  . metoprolol  100 mg Oral BID    Continuous Infusions: . sodium chloride 75 mL/hr at 06/07/13 [id number]    Past Medical History  Diagnosis Date  . ASCVD (arteriosclerotic cardiovascular disease)     CABG surgery in 5/00  . Hypertension     Unspecified  . Hyperlipidemia   . Anemia, mild     Hemoglobin of 11.5-12  . Hearing loss   . 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 Diverticular disease   . DJD (degenerative joint disease)      Left knee    Past Surgical History  Procedure Laterality Date  . Coronary artery bypass graft  08/1998  . Tonsillectomy and adenoidectomy    . Total abdominal hysterectomy    . Melanoma excision      from right arm  . Cataract extraction      with lens implantation    Jarold MottoSamantha Vale Mousseau MS, RD, LDN Inpatient Registered Dietitian Pager: [phone] After-hours pager: [phone]

## 2013-06-07 NOTE — Evaluation (Signed)
Physical Therapy Evaluation Patient Details Name: Olivia Nixon MRN: [id number] DOB: 07/31/1917 Today's Date: 06/07/2013 Time: [id number] PT Time Calculation (min): 28 min  PT Assessment / Plan / Recommendation History of Present Illness  Olivia Nixon is a 80 y.o. female who presents with progressive decline.  She has had progressive loss of ability to walk and progressive mental decline since Sept of last year.  This culminated in a fall yesterday.  Thankfully patient was uninjured in the fall, CT scans of head yesterday and today are negative.  Clinical Impression  Pt adm due to the above. Presents with significant decrease in independence with functional mobility and cognitive deficits. Pt to benefit from skilled PT to address deficits listed below (see PT problem list) and increase mobility. Pt demo decreased safety awareness and increased anxiety with transfers throughout session. Requires incr time for all mobility and total (A) to maintain balance.     PT Assessment  Patient needs continued PT services    Follow Up Recommendations  SNF;Supervision/Assistance - 24 hour    Does the patient have the potential to tolerate intense rehabilitation      Barriers to Discharge Decreased caregiver support pt lives alone     Equipment Recommendations  Other (comment) (TBD at SNF)    Recommendations for Other Services OT consult   Frequency Min 2X/week    Precautions / Restrictions Precautions Precautions: Fall Precaution Comments: family reports multiple recent falls  Restrictions Weight Bearing Restrictions: No   Pertinent Vitals/Pain C/o "gas pains" did not rate; RN aware.       Mobility  Bed Mobility Overal bed mobility: Needs Assistance Bed Mobility: Supine to Sit;Sit to Supine Supine to sit: Mod assist;HOB elevated Sit to supine: Mod assist General bed mobility comments: pt requires incr time and max directional cues for sequencing; relies heavily on handrails for  mobility; (A) to elevate trunk and (A) LEs to sitting position; pt minimally able to advance LEs onto bed but requires (A) to manage trunk; decr safety awareness with bed mobility; almost hit her head on railing x 2  Transfers Overall transfer level: Needs assistance Equipment used: None Transfers: Sit to/from [employer] CorporationStand;Stand Pivot Transfers Sit to Stand: Total assist;From elevated surface Stand pivot transfers: Total assist;From elevated surface General transfer comment: performed SPT bed <> 3 in 1; pt very unsteady with transfers and pushing posteriorly fearful of falling and requires max multidirectional cues for safety and hand placement; unable to use RW at this time due to anxiety and fear of falling; fwd flexed posture throughout transfers; total (A) with use of gt belt to perform transfers; performed sit to stand x3 prior to attempting transfer to get pt comfortable; pt very anxious throughout session  Ambulation/Gait General Gait Details: will require 2 person (A) for safe ambulation at this time          PT Diagnosis: Difficulty walking;Generalized weakness;Acute pain  PT Problem List: Decreased strength;Decreased activity tolerance;Decreased balance;Decreased mobility;Decreased cognition;Decreased knowledge of use of DME;Decreased safety awareness;Decreased knowledge of precautions;Pain PT Treatment Interventions: DME instruction;Gait training;Therapeutic activities;Functional mobility training;Therapeutic exercise;Balance training;Neuromuscular re-education;Patient/family education     PT Goals(Current goals can be found in the care plan section) Acute Rehab PT Goals Patient Stated Goal: to get to the bathroom  PT Goal Formulation: With patient Time For Goal Achievement: 06/21/13 Potential to Achieve Goals: Good  Visit Information  Last PT Received On: 06/07/13 Assistance Needed: +2 History of Present Illness: Olivia Nixon is a 80 y.o. female who presents  with progressive  decline.  She has had progressive loss of ability to walk and progressive mental decline since Sept of last year.  This culminated in a fall yesterday.  Thankfully patient was uninjured in the fall, CT scans of head yesterday and today are negative.       Prior Functioning  Home Living Family/patient expects to be discharged to:: Skilled nursing facility Living Arrangements: Alone Additional Comments: Pt living alone prior to admission; family reports pt has recently required incr (A) at home and unable to drive; neighbor has been driving pt to/from store and appointments  Prior Function Level of Independence: Needs assistance Comments: pt has been needing incr (A) for past few months; Communication Communication: HOH Dominant Hand: Right    Cognition  Cognition Arousal/Alertness: Awake/alert Behavior During Therapy: Flat affect Overall Cognitive Status: Impaired/Different from baseline Area of Impairment: Orientation;Memory;Following commands;Safety/judgement;Awareness;Problem solving Orientation Level: Disoriented to;Time;Situation;Place Memory: Decreased short-term memory Following Commands: Follows one step commands inconsistently Safety/Judgement: Decreased awareness of deficits;Decreased awareness of safety Awareness: Emergent Problem Solving: Slow processing;Decreased initiation;Difficulty sequencing;Requires verbal cues;Requires tactile cues General Comments: pt is a poor historian; family present to give history and PLOF     Extremity/Trunk Assessment Upper Extremity Assessment Upper Extremity Assessment: Defer to OT evaluation Lower Extremity Assessment Lower Extremity Assessment: Generalized weakness Cervical / Trunk Assessment Cervical / Trunk Assessment: Kyphotic   Balance Balance Overall balance assessment: Needs assistance;History of Falls Sitting-balance support: Feet supported;Bilateral upper extremity supported Sitting balance-[name]: Poor Sitting balance  - Comments: leaning posteriorly; max cues for upright posture requires (A) to achieve and is unable to maintain >30 seconds  Postural control: Posterior lean Standing balance support: During functional activity;Bilateral upper extremity supported Standing balance-[name]: Zero Standing balance comment: requries total (A); very unsteady and fearful of falling  General Comments General comments (skin integrity, edema, etc.): IV site bleeding; RN notified   End of Session PT - End of Session Equipment Utilized During Treatment: Gait belt Activity Tolerance: Patient limited by fatigue;Patient limited by pain Patient left: in bed;with call bell/phone within reach;with bed alarm set;with family/visitor present Nurse Communication: Mobility status;Precautions  GP Functional Assessment Tool Used: clinical judgement  Functional Limitation: Mobility: Walking and moving around Mobility: Walking and Moving Around Current Status ([id number]): At least 80 percent but less than 100 percent impaired, limited or restricted Mobility: Walking and Moving Around Goal Status [phone]): At least 20 percent but less than 40 percent impaired, limited or restricted   Donell Sievert ,[place] [phone] 06/07/2013, 2:32 PM

## 2013-06-07 NOTE — ED Notes (Signed)
Pt walked roughly 10 feet in 5 minutes. Pt dragged her right leg which according to her was normal. She stated about some type of surgery on her right leg which is why she was dragging her right leg.

## 2013-06-07 NOTE — Progress Notes (Signed)
Olivia Nixon is an 80 y.o. female.   Chief Complaint: Generalized Weakness HPI: ASCVD, HTN, Hyperlipedemia, Anemia, Hearing loss, Hypothyroid, Diverticular Dse., DJD  Past Medical History  Diagnosis Date  . ASCVD (arteriosclerotic cardiovascular disease)     CABG surgery in 5/00  . Hypertension     Unspecified  . Hyperlipidemia   . Anemia, mild     Hemoglobin of 11.5-12  . Hearing loss   . 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 Diverticular disease   . DJD (degenerative joint disease)     Left knee    Past Surgical History  Procedure Laterality Date  . Coronary artery bypass graft  08/1998  . Tonsillectomy and adenoidectomy    . Total abdominal hysterectomy    . Melanoma excision      from right arm  . Cataract extraction      with lens implantation    History reviewed. No pertinent family history. Social History:  reports that she has never smoked. She does not have any smokeless tobacco history on file. She reports that she does not drink alcohol. Her drug history is not on file.  Allergies:  Allergies  Allergen Reactions  . Amoxicillin   . Bisacodyl   . Celebrex [Celecoxib]   . Chlorthalidone   . Ciprofloxacin   . Codeine   . Darvocet [Propoxyphene N-Acetaminophen]   . Diclofenac Sodium   . Flagyl [Metronidazole Hcl]   . Ibuprofen   . Iodine   . Meloxicam   . Methocarbamol   . Oruvail [Ketoprofen]   . Ropinirole Hcl   . Sulfa Antibiotics   . Valium   . Zolpidem Tartrate     Medications Prior to Admission  Medication Sig Dispense Refill  . acetaminophen (TYLENOL ARTHRITIS PAIN) 650 MG CR tablet Take 650 mg by mouth every morning.      Marland Kitchen aspirin 81 MG tablet Take 81 mg by mouth daily.        . fluticasone (FLONASE) 50 MCG/ACT nasal spray Place 1 spray into both nostrils daily.      Marland Kitchen levocetirizine (XYZAL) 5 MG tablet Take 5 mg by mouth daily.       Marland Kitchen levothyroxine (SYNTHROID, LEVOTHROID) 50  MCG tablet Take 50 mcg by mouth daily.        . metoprolol (LOPRESSOR) 100 MG tablet Take 100 mg by mouth 2 (two) times daily.        Marland Kitchen olmesartan (BENICAR) 40 MG tablet Take 40 mg by mouth daily.          Results for orders placed during the hospital encounter of 06/06/13 (from the past 48 hour(s))  CBC     Status: Abnormal   Collection Time    06/06/13  7:22 PM      Result Value Ref Range   WBC 6.5  4.0 - 10.5 K/uL   RBC 4.21  3.87 - 5.11 MIL/uL   Hemoglobin 13.1  12.0 - 15.0 g/dL   HCT 37.6  36.0 - 46.0 %   MCV 89.3  78.0 - 100.0 fL   MCH 31.1  26.0 - 34.0 pg   MCHC 34.8  30.0 - 36.0 g/dL   RDW 15.6 (*) 11.5 - 15.5 %   Platelets 197  150 - 400 K/uL  BASIC METABOLIC PANEL     Status: Abnormal   Collection Time    06/06/13  7:22 PM  Result Value Ref Range   Sodium 128 (*) 137 - 147 mEq/L   Potassium 3.9  3.7 - 5.3 mEq/L   Chloride 89 (*) 96 - 112 mEq/L   CO2 23  19 - 32 mEq/L   Glucose, Bld 103 (*) 70 - 99 mg/dL   BUN 13  6 - 23 mg/dL   Creatinine, Ser 0.62  0.50 - 1.10 mg/dL   Calcium 8.8  8.4 - 10.5 mg/dL   GFR calc non Af Amer 75 (*) >90 mL/min   GFR calc Af Amer 87 (*) >90 mL/min   Comment: (NOTE)     The eGFR has been calculated using the CKD EPI equation.     This calculation has not been validated in all clinical situations.     eGFR's persistently <90 mL/min signify possible Chronic Kidney     Disease.  I-STAT CHEM 8, ED     Status: Abnormal   Collection Time    06/06/13  8:21 PM      Result Value Ref Range   Sodium 127 (*) 137 - 147 mEq/L   Potassium 3.7  3.7 - 5.3 mEq/L   Chloride 90 (*) 96 - 112 mEq/L   BUN 14  6 - 23 mg/dL   Creatinine, Ser 0.70  0.50 - 1.10 mg/dL   Glucose, Bld 106 (*) 70 - 99 mg/dL   Calcium, Ion 1.06 (*) 1.13 - 1.30 mmol/L   TCO2 25  0 - 100 mmol/L   Hemoglobin 13.9  12.0 - 15.0 g/dL   HCT 41.0  36.0 - 46.0 %  CK     Status: Abnormal   Collection Time    06/06/13 10:45 PM      Result Value Ref Range   Total CK 430 (*) 7 -  177 U/L  TROPONIN I     Status: None   Collection Time    06/06/13 10:45 PM      Result Value Ref Range   Troponin I <0.30  <0.30 ng/mL   Comment:            Due to the release kinetics of cTnI,     a negative result within the first hours     of the onset of symptoms does not rule out     myocardial infarction with certainty.     If myocardial infarction is still suspected,     repeat the test at appropriate intervals.  URINALYSIS, ROUTINE W REFLEX MICROSCOPIC     Status: Abnormal   Collection Time    06/06/13 11:36 PM      Result Value Ref Range   Color, Urine YELLOW  YELLOW   APPearance CLOUDY (*) CLEAR   Specific Gravity, Urine 1.017  1.005 - 1.030   pH 7.5  5.0 - 8.0   Glucose, UA NEGATIVE  NEGATIVE mg/dL   Hgb urine dipstick NEGATIVE  NEGATIVE   Bilirubin Urine NEGATIVE  NEGATIVE   Ketones, ur NEGATIVE  NEGATIVE mg/dL   Protein, ur 30 (*) NEGATIVE mg/dL   Urobilinogen, UA 1.0  0.0 - 1.0 mg/dL   Nitrite NEGATIVE  NEGATIVE   Leukocytes, UA SMALL (*) NEGATIVE  URINE MICROSCOPIC-ADD ON     Status: Abnormal   Collection Time    06/06/13 11:36 PM      Result Value Ref Range   Squamous Epithelial / LPF FEW (*) RARE   WBC, UA 7-10  <3 WBC/hpf   RBC / HPF 0-2  <3 RBC/hpf  Bacteria, UA MANY (*) RARE  CBG MONITORING, ED     Status: None   Collection Time    06/07/13  1:28 AM      Result Value Ref Range   Glucose-Capillary 97  70 - 99 mg/dL   Dg Chest 2 View  06/06/2013   CLINICAL DATA:  Fall.  Right-sided pain.  EXAM: CHEST  2 VIEW  COMPARISON:  Right rib films same date. 06/03/2005 chest x-ray. 06/05/2005 chest CT.  FINDINGS: Remote right eighth rib fracture. No acute rib fracture or pneumothorax is noted.  Blunting posterior sulcus on lateral view. This may represent scarring although small right-sided pleural effusion not excluded  Scoliosis and degenerative changes without obvious compression fracture.  Post CABG.  Cardiomegaly.  Calcified tortuous aorta.  Basilar  subsegmental atelectasis.  IMPRESSION: Remote right eighth rib fracture. No acute rib fracture or pneumothorax is noted.  Blunting posterior sulcus on lateral view. This may represent scarring although small right-sided pleural effusion not excluded  Scoliosis and degenerative changes without obvious compression fracture.  Post CABG.  Cardiomegaly.  Calcified tortuous aorta.  Basilar subsegmental atelectasis.   Electronically Signed   By: Chauncey Cruel M.D.   On: 06/06/2013 22:34   Dg Ribs Unilateral Right  06/06/2013   CLINICAL DATA:  Fall from standing position. Pain right-sided chest.  EXAM: RIGHT RIBS - 2 VIEW  COMPARISON:  None.  FINDINGS: Remote right eighth rib fracture. No acute rib fracture or pneumothorax is noted.  There may be a small right-sided pleural effusion.  If it would change the patient's course of therapy to detect a subtle rib fracture, then CT the chest may be considered.  IMPRESSION: Remote right eighth rib fracture. No acute rib fracture or pneumothorax is noted.  There may be a small right-sided pleural effusion.  If it would change the patient's course of therapy to detect a subtle rib fracture, then CT the chest may be considered.   Electronically Signed   By: Chauncey Cruel M.D.   On: 06/06/2013 22:36   Ct Head Wo Contrast  06/06/2013   CLINICAL DATA:  Weakness.  Fall.  Hypertension.  EXAM: CT HEAD WITHOUT CONTRAST  TECHNIQUE: Contiguous axial images were obtained from the base of the skull through the vertex without intravenous contrast.  COMPARISON:  06/05/2013.  FINDINGS: No skull fracture or intracranial hemorrhage.  Left parietal 8 mm calvaria lucency may represent a small hemangioma. Slight bony overgrowth anterior superior margin mastoid region felt to be an incidental finding possibly very small en plaque meningiomas.  Prominent vascular calcifications.  Small vessel disease type changes without CT evidence of large acute infarct.  Atrophy. Ventricular prominence probably  related to atrophy rather than hydrocephalus and unchanged.  IMPRESSION: No skull fracture or intracranial hemorrhage.  Small vessel disease type changes without CT evidence of large acute infarct.  Atrophy. Ventricular prominence probably related to atrophy rather than hydrocephalus and unchanged.   Electronically Signed   By: Chauncey Cruel M.D.   On: 06/06/2013 20:09   Ct Head Wo Contrast  06/05/2013   CLINICAL DATA:  Fall.  Left-sided neck pain.  EXAM: CT HEAD WITHOUT CONTRAST  CT CERVICAL SPINE WITHOUT CONTRAST  TECHNIQUE: Multidetector CT imaging of the head and cervical spine was performed following the standard protocol without intravenous contrast. Multiplanar CT image reconstructions of the cervical spine were also generated.  COMPARISON:  None.  FINDINGS: CT HEAD FINDINGS  No skull fracture or intracranial hemorrhage.  Atrophy. Ventricular prominence probably related  to atrophy rather than hydrocephalus.  No CT evidence of large acute infarct.  No intracranial mass lesion noted on this unenhanced exam.  Vascular calcifications.  Partial opacification right sphenoid sinus air cell.  CT CERVICAL SPINE FINDINGS  No cervical spine fracture.  Anterior slippage of C4 felt to be related to degenerative changes.  Cervical Spondylotic changes most prominent C5-6 and C6-7. No abnormal prevertebral soft tissue swelling. If ligamentous injury or cord injury were of high clinical concern, MR imaging could be obtained for further delineation.  Enlarged left thyroid gland with substernal extension measuring up to 3 cm. This can be followed with thyroid ultrasound.  IMPRESSION: CT HEAD :  No skull fracture or intracranial hemorrhage.  Atrophy.  Partial opacification right sphenoid sinus air cell.  CT CERVICAL SPINE:  No cervical spine fracture.  Anterior slippage of C4 felt to be related to degenerative changes.  Cervical Spondylotic changes most prominent C5-6 and C6-7. No abnormal prevertebral soft tissue swelling. If  ligamentous injury or cord injury were of high clinical concern, MR imaging could be obtained for further delineation.  Enlarged left thyroid gland with substernal extension measuring up to 3 cm. This can be followed with thyroid ultrasound.   Electronically Signed   By: Chauncey Cruel M.D.   On: 06/05/2013 22:44   Ct Cervical Spine Wo Contrast  06/05/2013   CLINICAL DATA:  Fall.  Left-sided neck pain.  EXAM: CT HEAD WITHOUT CONTRAST  CT CERVICAL SPINE WITHOUT CONTRAST  TECHNIQUE: Multidetector CT imaging of the head and cervical spine was performed following the standard protocol without intravenous contrast. Multiplanar CT image reconstructions of the cervical spine were also generated.  COMPARISON:  None.  FINDINGS: CT HEAD FINDINGS  No skull fracture or intracranial hemorrhage.  Atrophy. Ventricular prominence probably related to atrophy rather than hydrocephalus.  No CT evidence of large acute infarct.  No intracranial mass lesion noted on this unenhanced exam.  Vascular calcifications.  Partial opacification right sphenoid sinus air cell.  CT CERVICAL SPINE FINDINGS  No cervical spine fracture.  Anterior slippage of C4 felt to be related to degenerative changes.  Cervical Spondylotic changes most prominent C5-6 and C6-7. No abnormal prevertebral soft tissue swelling. If ligamentous injury or cord injury were of high clinical concern, MR imaging could be obtained for further delineation.  Enlarged left thyroid gland with substernal extension measuring up to 3 cm. This can be followed with thyroid ultrasound.  IMPRESSION: CT HEAD :  No skull fracture or intracranial hemorrhage.  Atrophy.  Partial opacification right sphenoid sinus air cell.  CT CERVICAL SPINE:  No cervical spine fracture.  Anterior slippage of C4 felt to be related to degenerative changes.  Cervical Spondylotic changes most prominent C5-6 and C6-7. No abnormal prevertebral soft tissue swelling. If ligamentous injury or cord injury were of high  clinical concern, MR imaging could be obtained for further delineation.  Enlarged left thyroid gland with substernal extension measuring up to 3 cm. This can be followed with thyroid ultrasound.   Electronically Signed   By: Chauncey Cruel M.D.   On: 06/05/2013 22:44    ROS  Blood pressure 144/71, pulse 86, temperature 97.9 F (36.6 C), temperature source Oral, resp. rate 18, height 5' (1.524 m), weight 52.164 kg (115 lb), SpO2 97.00%. Physical Exam   Assessment/Plan Pt arrived on floor. Alert and oriented. Able to make needs known. Sister at bedside. Vital signs taken and stable. Call light place within reached. Bed on its lowest position. High  fall risk prec observed. Will continue to monitor.   Mina Marble 06/07/2013, 3:13 AM

## 2013-06-07 NOTE — Clinical Social Work Psychosocial (Signed)
Clinical Social Work Department BRIEF PSYCHOSOCIAL ASSESSMENT 06/07/2013  Patient:  Olivia Nixon, Olivia Nixon     Account Number:  [id number]     Admit date:  06/06/2013  Clinical Social Worker:  Lovey Newcomer  Date/Time:  06/07/2013 11:31 AM  Referred by:  Physician  Date Referred:  06/07/2013 Referred for  SNF Placement   Other Referral:   Interview type:  Family Other interview type:   CSW met with sisters to complete assessment as patient was being bathed during assessment. Also, chart indicates that patient is not oriented x4 at this time.    PSYCHOSOCIAL DATA Living Status:  ALONE Admitted from facility:   Level of care:   Primary support name:  Olivia Nixon and Olivia Nixon Primary support relationship to patient:  SIBLING Degree of support available:   Support is good, but patient lives alone at this time.    CURRENT CONCERNS Current Concerns  Post-Acute Placement   Other Concerns:    SOCIAL WORK ASSESSMENT / PLAN CSW met with patient's sisters to complete assessment as patient is not oriented x4 at this time. CSW will followup with patient once official SNF recommendation has been received and is more oriented. Patient's sisters are very concerned about the patient's safety at home alone. They state that they have noticed changes in the patient over the past 18 months. They both state that patient isn't able to do for herself as much as she used to. The sisters state that the patient experienced a fall     CSW explained that PT would likely recommend SNF and that if this recommendation is made we can start looking at the SNF options available for patient. CSW also explained that if patient has capacity and can make her own decisions that the patient will have to be agreeable to placement. Sisters state that patient is "very stubborn, and she knows how to manipulate people." and that patient will likely not be agreeable to anything but going home. CSW explained that patient has the  right to self determination if she has the capacity to make these decisions. Both sisters became teary eyed by the thought of the patient returning home alone. CSW offered emotional support to sisters and explained that every effort will be made to get the patient to come around to considering at least a short term SNF stay. CSW also mentioned that the family can also try to talking to the patient about assisted living facilities for the future.    CSW notes that patient is currently observation and has [employer]. CSW explained that in order for Medicare to cover the expenses of SNF, the patient must has a 3 night qualifying inpatient stay. Sisters understood and hope that she will be admitted inpatient.    Patient does have a son, but her son was recently diagnosed with bone cancer and is not in the position to take care of his mother. Her sister Olivia Nixon lives in [place], and her other sister lives in [place], [place]. Olivia Nixon states that she would prefer that patient go to [facility].   Assessment/plan status:  Psychosocial Support/Ongoing Assessment of Needs Other assessment/ plan:   Complete FL2, Fax, PASRR   Information/referral to community resources:   CSW contact information and SNF list given to patient's sisters at bedside.    PATIENT'S/FAMILY'S RESPONSE TO PLAN OF CARE: Patient's sisters are agreeable to looking at SNF options for patient. Sisters are clearly worried by patient living alone and became quite teary eyed during assessment. Sister were very  pleasant and appreciative of CSW contact. CSW will follow up with bed offers when available.       Liz Beach, [place], [place], [phone]

## 2013-06-07 NOTE — Progress Notes (Signed)
Patient seen and examined, database reviewed. Seen earlier today by my colleague Dr. Julian ReilGardner. Generalized weakness, UTI and chronic progressive decline in functional status. Started on antibiotics, switched to inpatient stay.  Clint LippsMutaz A Shane Melby Pager: [phone]: [phone] 06/07/2013, 2:21 PM

## 2013-06-07 NOTE — H&P (Signed)
[facility] History and Physical  Olivia Carpenllen Y Pettigrew AVW:[id number] DOB: 08/06/1917 DOA: 06/06/2013  Referring physician: EDP PCP: Colette RibasGOLDING, JOHN CABOT, MD   Chief Complaint: Generalized weakness   HPI: Olivia Nixon is a 80 y.o. female who presents with progressive decline.  She has had progressive loss of ability to walk and progressive mental decline since Sept of last year.  This culminated in a fall yesterday.  Thankfully patient was uninjured in the fall, CT scans of head yesterday and today are negative.  Family feels patient is unsafe at home as evidenced by recurring falls, and also burn wounds on arms from cooking.  Review of Systems: Systems reviewed.  As above, otherwise negative  Past Medical History  Diagnosis Date  . ASCVD (arteriosclerotic cardiovascular disease)     CABG surgery in 5/00  . Hypertension     Unspecified  . Hyperlipidemia   . Anemia, mild     Hemoglobin of 11.5-12  . Hearing loss   . 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 Diverticular disease   . DJD (degenerative joint disease)     Left knee   Past Surgical History  Procedure Laterality Date  . Coronary artery bypass graft  08/1998  . Tonsillectomy and adenoidectomy    . Total abdominal hysterectomy    . Melanoma excision      from right arm  . Cataract extraction      with lens implantation   Social History:  reports that she has never smoked. She does not have any smokeless tobacco history on file. She reports that she does not drink alcohol. Her drug history is not on file.  Allergies  Allergen Reactions  . Amoxicillin   . Bisacodyl   . Celebrex [Celecoxib]   . Chlorthalidone   . Ciprofloxacin   . Codeine   . Darvocet [Propoxyphene N-Acetaminophen]   . Diclofenac Sodium   . Flagyl [Metronidazole Hcl]   . Ibuprofen   . Iodine   . Meloxicam   . Methocarbamol   . Oruvail [Ketoprofen]   . Ropinirole Hcl   . Sulfa  Antibiotics   . Valium   . Zolpidem Tartrate     History reviewed. No pertinent family history.   Prior to Admission medications   Medication Sig Start Date End Date Taking? Authorizing Provider  acetaminophen (TYLENOL ARTHRITIS PAIN) 650 MG CR tablet Take 650 mg by mouth every morning.   Yes Historical Provider, MD  aspirin 81 MG tablet Take 81 mg by mouth daily.     Yes Historical Provider, MD  fluticasone (FLONASE) 50 MCG/ACT nasal spray Place 1 spray into both nostrils daily. 05/10/13  Yes Historical Provider, MD  levocetirizine (XYZAL) 5 MG tablet Take 5 mg by mouth daily.    Yes Historical Provider, MD  levothyroxine (SYNTHROID, LEVOTHROID) 50 MCG tablet Take 50 mcg by mouth daily.     Yes Historical Provider, MD  metoprolol (LOPRESSOR) 100 MG tablet Take 100 mg by mouth 2 (two) times daily.     Yes Historical Provider, MD  olmesartan (BENICAR) 40 MG tablet Take 40 mg by mouth daily.     Yes Historical Provider, MD   Physical Exam: Filed Vitals:   06/06/13 2243  BP: 170/78  Pulse: 79  Temp:   Resp: 18    BP 170/78  Pulse 79  Temp(Src) 98.7 F (37.1 C) (Oral)  Resp 18  Ht 5' (1.524 m)  Wt 52.164 kg (115 lb)  BMI 22.46 kg/m2  SpO2 100%  General Appearance:    Alert, no distress, appears stated age  Head:    Normocephalic, atraumatic  Eyes:    PERRL, EOMI, sclera non-icteric        Nose:   Nares without drainage or epistaxis. Mucosa, turbinates normal  Throat:   Moist mucous membranes. Oropharynx without erythema or exudate.  Neck:   Supple. No carotid bruits.  No thyromegaly.  No lymphadenopathy.   Back:     No CVA tenderness, no spinal tenderness  Lungs:     Clear to auscultation bilaterally, without wheezes, rhonchi or rales  Chest wall:    No tenderness to palpitation  Heart:    Regular rate and rhythm without murmurs, gallops, rubs  Abdomen:     Soft, non-tender, nondistended, normal bowel sounds, no organomegaly  Genitalia:    deferred  Rectal:    deferred   Extremities:   No clubbing, cyanosis or edema.  Pulses:   2+ and symmetric all extremities  Skin:   Skin color, texture, turgor normal, no rashes or lesions  Lymph nodes:   Cervical, supraclavicular, and axillary nodes normal  Neurologic:   CNII-XII intact. Normal strength, sensation and reflexes      throughout    Labs on Admission:  Basic Metabolic Panel:  Recent Labs Lab 06/06/13 1922 06/06/13 2021  NA 128* 127*  K 3.9 3.7  CL 89* 90*  CO2 23  --   GLUCOSE 103* 106*  BUN 13 14  CREATININE 0.62 0.70  CALCIUM 8.8  --    Liver Function Tests: No results found for this basename: AST, ALT, ALKPHOS, BILITOT, PROT, ALBUMIN,  in the last 168 hours No results found for this basename: LIPASE, AMYLASE,  in the last 168 hours No results found for this basename: AMMONIA,  in the last 168 hours CBC:  Recent Labs Lab 06/06/13 1922 06/06/13 2021  WBC 6.5  --   HGB 13.1 13.9  HCT 37.6 41.0  MCV 89.3  --   PLT 197  --    Cardiac Enzymes:  Recent Labs Lab 06/06/13 2245  CKTOTAL 430*  TROPONINI <0.30    BNP (last 3 results) No results found for this basename: PROBNP,  in the last 8760 hours CBG:  Recent Labs Lab 06/07/13 0128  GLUCAP 97    Radiological Exams on Admission: Dg Chest 2 View  06/06/2013   CLINICAL DATA:  Fall.  Right-sided pain.  EXAM: CHEST  2 VIEW  COMPARISON:  Right rib films same date. 06/03/2005 chest x-ray. 06/05/2005 chest CT.  FINDINGS: Remote right eighth rib fracture. No acute rib fracture or pneumothorax is noted.  Blunting posterior sulcus on lateral view. This may represent scarring although small right-sided pleural effusion not excluded  Scoliosis and degenerative changes without obvious compression fracture.  Post CABG.  Cardiomegaly.  Calcified tortuous aorta.  Basilar subsegmental atelectasis.  IMPRESSION: Remote right eighth rib fracture. No acute rib fracture or pneumothorax is noted.  Blunting posterior sulcus on lateral view. This may  represent scarring although small right-sided pleural effusion not excluded  Scoliosis and degenerative changes without obvious compression fracture.  Post CABG.  Cardiomegaly.  Calcified tortuous aorta.  Basilar subsegmental atelectasis.   Electronically Signed   By: Bridgett Larsson M.D.   On: 06/06/2013 22:34   Dg Ribs Unilateral Right  06/06/2013   CLINICAL DATA:  Fall from standing position. Pain right-sided chest.  EXAM: RIGHT RIBS -  2 VIEW  COMPARISON:  None.  FINDINGS: Remote right eighth rib fracture. No acute rib fracture or pneumothorax is noted.  There may be a small right-sided pleural effusion.  If it would change the patient's course of therapy to detect a subtle rib fracture, then CT the chest may be considered.  IMPRESSION: Remote right eighth rib fracture. No acute rib fracture or pneumothorax is noted.  There may be a small right-sided pleural effusion.  If it would change the patient's course of therapy to detect a subtle rib fracture, then CT the chest may be considered.   Electronically Signed   By: Bridgett Larsson M.D.   On: 06/06/2013 22:36   Ct Head Wo Contrast  06/06/2013   CLINICAL DATA:  Weakness.  Fall.  Hypertension.  EXAM: CT HEAD WITHOUT CONTRAST  TECHNIQUE: Contiguous axial images were obtained from the base of the skull through the vertex without intravenous contrast.  COMPARISON:  06/05/2013.  FINDINGS: No skull fracture or intracranial hemorrhage.  Left parietal 8 mm calvaria lucency may represent a small hemangioma. Slight bony overgrowth anterior superior margin mastoid region felt to be an incidental finding possibly very small en plaque meningiomas.  Prominent vascular calcifications.  Small vessel disease type changes without CT evidence of large acute infarct.  Atrophy. Ventricular prominence probably related to atrophy rather than hydrocephalus and unchanged.  IMPRESSION: No skull fracture or intracranial hemorrhage.  Small vessel disease type changes without CT evidence of  large acute infarct.  Atrophy. Ventricular prominence probably related to atrophy rather than hydrocephalus and unchanged.   Electronically Signed   By: Bridgett Larsson M.D.   On: 06/06/2013 20:09   Ct Head Wo Contrast  06/05/2013   CLINICAL DATA:  Fall.  Left-sided neck pain.  EXAM: CT HEAD WITHOUT CONTRAST  CT CERVICAL SPINE WITHOUT CONTRAST  TECHNIQUE: Multidetector CT imaging of the head and cervical spine was performed following the standard protocol without intravenous contrast. Multiplanar CT image reconstructions of the cervical spine were also generated.  COMPARISON:  None.  FINDINGS: CT HEAD FINDINGS  No skull fracture or intracranial hemorrhage.  Atrophy. Ventricular prominence probably related to atrophy rather than hydrocephalus.  No CT evidence of large acute infarct.  No intracranial mass lesion noted on this unenhanced exam.  Vascular calcifications.  Partial opacification right sphenoid sinus air cell.  CT CERVICAL SPINE FINDINGS  No cervical spine fracture.  Anterior slippage of C4 felt to be related to degenerative changes.  Cervical Spondylotic changes most prominent C5-6 and C6-7. No abnormal prevertebral soft tissue swelling. If ligamentous injury or cord injury were of high clinical concern, MR imaging could be obtained for further delineation.  Enlarged left thyroid gland with substernal extension measuring up to 3 cm. This can be followed with thyroid ultrasound.  IMPRESSION: CT HEAD :  No skull fracture or intracranial hemorrhage.  Atrophy.  Partial opacification right sphenoid sinus air cell.  CT CERVICAL SPINE:  No cervical spine fracture.  Anterior slippage of C4 felt to be related to degenerative changes.  Cervical Spondylotic changes most prominent C5-6 and C6-7. No abnormal prevertebral soft tissue swelling. If ligamentous injury or cord injury were of high clinical concern, MR imaging could be obtained for further delineation.  Enlarged left thyroid gland with substernal extension  measuring up to 3 cm. This can be followed with thyroid ultrasound.   Electronically Signed   By: Bridgett Larsson M.D.   On: 06/05/2013 22:44   Ct Cervical Spine Wo Contrast  06/05/2013  CLINICAL DATA:  Fall.  Left-sided neck pain.  EXAM: CT HEAD WITHOUT CONTRAST  CT CERVICAL SPINE WITHOUT CONTRAST  TECHNIQUE: Multidetector CT imaging of the head and cervical spine was performed following the standard protocol without intravenous contrast. Multiplanar CT image reconstructions of the cervical spine were also generated.  COMPARISON:  None.  FINDINGS: CT HEAD FINDINGS  No skull fracture or intracranial hemorrhage.  Atrophy. Ventricular prominence probably related to atrophy rather than hydrocephalus.  No CT evidence of large acute infarct.  No intracranial mass lesion noted on this unenhanced exam.  Vascular calcifications.  Partial opacification right sphenoid sinus air cell.  CT CERVICAL SPINE FINDINGS  No cervical spine fracture.  Anterior slippage of C4 felt to be related to degenerative changes.  Cervical Spondylotic changes most prominent C5-6 and C6-7. No abnormal prevertebral soft tissue swelling. If ligamentous injury or cord injury were of high clinical concern, MR imaging could be obtained for further delineation.  Enlarged left thyroid gland with substernal extension measuring up to 3 cm. This can be followed with thyroid ultrasound.  IMPRESSION: CT HEAD :  No skull fracture or intracranial hemorrhage.  Atrophy.  Partial opacification right sphenoid sinus air cell.  CT CERVICAL SPINE:  No cervical spine fracture.  Anterior slippage of C4 felt to be related to degenerative changes.  Cervical Spondylotic changes most prominent C5-6 and C6-7. No abnormal prevertebral soft tissue swelling. If ligamentous injury or cord injury were of high clinical concern, MR imaging could be obtained for further delineation.  Enlarged left thyroid gland with substernal extension measuring up to 3 cm. This can be followed with  thyroid ultrasound.   Electronically Signed   By: Bridgett Larsson M.D.   On: 06/05/2013 22:44    EKG: Independently reviewed.  Assessment/Plan Principal Problem:   Generalized weakness   1. Generalized weakness - chronic progressive decline likely due to progressive dementia and age, family feels patient unsafe at home so will admit.  PT/OT, there is mild hyponatremia which I will put patient on NS for.  SW consult for NH placement.      Code Status: Full  Family Communication: Family at bedside Disposition Plan: Admit to inpatient   Time spent: 30 min  GARDNER, JARED M. [facility] Pager [phone]  If 7AM-7PM, please contact the day team taking care of the patient [email or web address] Password [email or web address] 06/07/2013, 1:39 AM

## 2013-06-08 DIAGNOSIS — D649 Anemia, unspecified: Secondary | ICD-10-CM

## 2013-06-08 DIAGNOSIS — K5730 Diverticulosis of large intestine without perforation or abscess without bleeding: Secondary | ICD-10-CM

## 2013-06-08 DIAGNOSIS — N390 Urinary tract infection, site not specified: Secondary | ICD-10-CM | POA: Diagnosis present

## 2013-06-08 DIAGNOSIS — E871 Hypo-osmolality and hyponatremia: Secondary | ICD-10-CM | POA: Diagnosis present

## 2013-06-08 DIAGNOSIS — R5381 Other malaise: Secondary | ICD-10-CM | POA: Diagnosis present

## 2013-06-08 LAB — CBC
HCT: 32.2 % — ABNORMAL LOW (ref 36.0–46.0)
Hemoglobin: 11.1 g/dL — ABNORMAL LOW (ref 12.0–15.0)
MCH: 30.5 pg (ref 26.0–34.0)
MCHC: 34.5 g/dL (ref 30.0–36.0)
MCV: 88.5 fL (ref 78.0–100.0)
PLATELETS: 185 10*3/uL (ref 150–400)
RBC: 3.64 MIL/uL — ABNORMAL LOW (ref 3.87–5.11)
RDW: 15.5 % (ref 11.5–15.5)
WBC: 4.3 10*3/uL (ref 4.0–10.5)

## 2013-06-08 LAB — CREATININE, URINE, RANDOM: CREATININE, URINE: 23.24 mg/dL

## 2013-06-08 LAB — BASIC METABOLIC PANEL
BUN: 9 mg/dL (ref 6–23)
CALCIUM: 7.8 mg/dL — AB (ref 8.4–10.5)
CO2: 23 meq/L (ref 19–32)
CREATININE: 0.56 mg/dL (ref 0.50–1.10)
Chloride: 90 mEq/L — ABNORMAL LOW (ref 96–112)
GFR calc non Af Amer: 78 mL/min — ABNORMAL LOW (ref >90)
Glucose, Bld: 91 mg/dL (ref 70–99)
Potassium: 4.1 mEq/L (ref 3.7–5.3)
Sodium: 125 mEq/L — ABNORMAL LOW (ref 137–147)

## 2013-06-08 LAB — TSH: TSH: 1.892 u[IU]/mL (ref 0.350–4.500)

## 2013-06-08 LAB — OSMOLALITY: OSMOLALITY: 257 mosm/kg — AB (ref 275–300)

## 2013-06-08 LAB — SODIUM, URINE, RANDOM: SODIUM UR: 116 meq/L

## 2013-06-08 MED ORDER — FLEET ENEMA 7-19 GM/118ML RE ENEM
1.0000 | ENEMA | Freq: Once | RECTAL | Status: DC
  Filled 2013-06-08: qty 1

## 2013-06-08 MED ORDER — POLYETHYLENE GLYCOL 3350 17 G PO PACK
17.0000 g | PACK | Freq: Every day | ORAL | Status: DC
  Administered 2013-06-08 – 2013-06-09 (×2): 17 g via ORAL
  Filled 2013-06-08 (×3): qty 1

## 2013-06-08 MED ORDER — GUAIFENESIN 100 MG/5ML PO SOLN
5.0000 mL | ORAL | Status: DC | PRN
  Administered 2013-06-09: 100 mg via ORAL
  Filled 2013-06-08 (×2): qty 5

## 2013-06-08 MED ORDER — HEPARIN SODIUM (PORCINE) 5000 UNIT/ML IJ SOLN
5000.0000 [IU] | Freq: Two times a day (BID) | INTRAMUSCULAR | Status: DC
  Administered 2013-06-08 – 2013-06-10 (×4): 5000 [IU] via SUBCUTANEOUS
  Filled 2013-06-08 (×6): qty 1

## 2013-06-08 NOTE — Progress Notes (Signed)
[facility] PROGRESS NOTE   Coralie Carpenllen Y Thune UJW:[id number] DOB: 08/16/1917 DOA: 06/06/2013 PCP: Colette RibasGOLDING, JOHN CABOT, MD  HPI/Subjective: Seen with sister at bedside, no complaints  Assessment/Plan: Principal Problem:   Generalized weakness Active Problems:   UTI (urinary tract infection)   Declining functional status   Hyponatremia   Generalized weakness -Chronic progressive decline in functional status likely secondary to progressive dementia and advanced age. -Family including 2 sisters feels patient is safe at home and needs placement. -PT/OT recommended skilled nursing facility, CSW to follow.  UTI -Urinalysis consistent with UTI -Patient started on Rocephin, adjust antibiotics according to culture results.  Hyponatremia -Likely secondary to dehydration, slight worsening after starting IV fluids. -Check urine electrolytes and TSH.  Recurrent falls -Patient has frequent falls at home, but was confirmed with her family.  Code Status: Full code Family Communication: Plan discussed with the patient. Disposition Plan: Remains inpatient   Consultants:  None  Procedures:  None  Antibiotics:  Rocephin   Objective: Filed Vitals:   06/08/13 1007  BP: 152/59  Pulse: 59  Temp:   Resp:     Intake/Output Summary (Last 24 hours) at 06/08/13 1210 Last data filed at 06/08/13 0900  Gross per 24 hour  Intake 769.83 ml  Output      4 ml  Net 765.83 ml   Filed Weights   06/06/13 1915  Weight: 52.164 kg (115 lb)    Exam: General: Alert and awake, oriented x3, not in any acute distress. HEENT: anicteric sclera, pupils reactive to light and accommodation, EOMI CVS: S1-S2 clear, no murmur rubs or gallops Chest: clear to auscultation bilaterally, no wheezing, rales or rhonchi Abdomen: soft nontender, nondistended, normal bowel sounds, no organomegaly Extremities: no cyanosis, clubbing or edema noted bilaterally Neuro: Cranial nerves II-XII intact, no  focal neurological deficits  Data Reviewed: Basic Metabolic Panel:  Recent Labs Lab 06/06/13 1922 06/06/13 2021 06/07/13 0544 06/08/13 0425  NA 128* 127* 128* 125*  K 3.9 3.7 4.2 4.1  CL 89* 90* 93* 90*  CO2 23  --  22 23  GLUCOSE 103* 106* 98 91  BUN 13 14 12 9   CREATININE 0.62 0.70 0.59 0.56  CALCIUM 8.8  --  8.3* 7.8*   Liver Function Tests: No results found for this basename: AST, ALT, ALKPHOS, BILITOT, PROT, ALBUMIN,  in the last 168 hours No results found for this basename: LIPASE, AMYLASE,  in the last 168 hours No results found for this basename: AMMONIA,  in the last 168 hours CBC:  Recent Labs Lab 06/06/13 1922 06/06/13 2021 06/08/13 0425  WBC 6.5  --  4.3  HGB 13.1 13.9 11.1*  HCT 37.6 41.0 32.2*  MCV 89.3  --  88.5  PLT 197  --  185   Cardiac Enzymes:  Recent Labs Lab 06/06/13 2245  CKTOTAL 430*  TROPONINI <0.30   BNP (last 3 results) No results found for this basename: PROBNP,  in the last 8760 hours CBG:  Recent Labs Lab 06/07/13 0128  GLUCAP 97    Micro No results found for this or any previous visit (from the past 240 hour(s)).   Studies: Dg Chest 2 View  06/06/2013   CLINICAL DATA:  Fall.  Right-sided pain.  EXAM: CHEST  2 VIEW  COMPARISON:  Right rib films same date. 06/03/2005 chest x-ray. 06/05/2005 chest CT.  FINDINGS: Remote right eighth rib fracture. No acute rib fracture or pneumothorax is noted.  Blunting posterior sulcus on lateral view. This may represent  scarring although small right-sided pleural effusion not excluded  Scoliosis and degenerative changes without obvious compression fracture.  Post CABG.  Cardiomegaly.  Calcified tortuous aorta.  Basilar subsegmental atelectasis.  IMPRESSION: Remote right eighth rib fracture. No acute rib fracture or pneumothorax is noted.  Blunting posterior sulcus on lateral view. This may represent scarring although small right-sided pleural effusion not excluded  Scoliosis and degenerative  changes without obvious compression fracture.  Post CABG.  Cardiomegaly.  Calcified tortuous aorta.  Basilar subsegmental atelectasis.   Electronically Signed   By: Bridgett Larsson M.D.   On: 06/06/2013 22:34   Dg Ribs Unilateral Right  06/06/2013   CLINICAL DATA:  Fall from standing position. Pain right-sided chest.  EXAM: RIGHT RIBS - 2 VIEW  COMPARISON:  None.  FINDINGS: Remote right eighth rib fracture. No acute rib fracture or pneumothorax is noted.  There may be a small right-sided pleural effusion.  If it would change the patient's course of therapy to detect a subtle rib fracture, then CT the chest may be considered.  IMPRESSION: Remote right eighth rib fracture. No acute rib fracture or pneumothorax is noted.  There may be a small right-sided pleural effusion.  If it would change the patient's course of therapy to detect a subtle rib fracture, then CT the chest may be considered.   Electronically Signed   By: Bridgett Larsson M.D.   On: 06/06/2013 22:36   Ct Head Wo Contrast  06/06/2013   CLINICAL DATA:  Weakness.  Fall.  Hypertension.  EXAM: CT HEAD WITHOUT CONTRAST  TECHNIQUE: Contiguous axial images were obtained from the base of the skull through the vertex without intravenous contrast.  COMPARISON:  06/05/2013.  FINDINGS: No skull fracture or intracranial hemorrhage.  Left parietal 8 mm calvaria lucency may represent a small hemangioma. Slight bony overgrowth anterior superior margin mastoid region felt to be an incidental finding possibly very small en plaque meningiomas.  Prominent vascular calcifications.  Small vessel disease type changes without CT evidence of large acute infarct.  Atrophy. Ventricular prominence probably related to atrophy rather than hydrocephalus and unchanged.  IMPRESSION: No skull fracture or intracranial hemorrhage.  Small vessel disease type changes without CT evidence of large acute infarct.  Atrophy. Ventricular prominence probably related to atrophy rather than  hydrocephalus and unchanged.   Electronically Signed   By: Bridgett Larsson M.D.   On: 06/06/2013 20:09    Scheduled Meds: . acetaminophen  650 mg Oral Daily  . aspirin EC  81 mg Oral Daily  . cefTRIAXone (ROCEPHIN)  IV  1 g Intravenous Q24H  . feeding supplement (ENSURE COMPLETE)  237 mL Oral BID BM  . fluticasone  1 spray Each Nare Daily  . heparin  5,000 Units Subcutaneous Q12H  . irbesartan  300 mg Oral Daily  . levothyroxine  50 mcg Oral QAC breakfast  . loratadine  10 mg Oral Daily  . metoprolol  100 mg Oral BID   Continuous Infusions: . sodium chloride 80 mL/hr at 06/08/13 0527       Time spent: 35 minutes    [facility] A  [facility] Pager [phone] If 7PM-7AM, please contact night-coverage at [email or web address], password [facility] 06/08/2013, 12:10 PM  LOS: 2 days

## 2013-06-08 NOTE — Clinical Social Work Note (Signed)
CSW visited patient at bedside to further discuss SNF placement upon DC. Patient is refusing any type of placement at this time. CSW attempted to elicit the pros and cons of SNF placement from patient but patient continues to insist that she is active at home and does not need rehab. She states "I am very active, I'm not the type to sit around. I can walk to my mailbox and clean my house." CSW asked patient if she has experienced any falls at home. She states that she fell one time when using her rolling walker. She blames this fall on the fact that her walker was missing rubber stoppers on some of the legs. CSW asked patient if there is any family or friends that can stay with her. She states that she doesn't need this and that her neighbors check in on her. CSW encouraged patient to consider SNF even if it's for just one week, but patient would not budge on her decision. CSW discussed this with MD.   Roddie McBryant Mashanda Ishibashi, [place]dale[place]CS[place], [place], [phone]

## 2013-06-08 NOTE — Evaluation (Signed)
Occupational Therapy Evaluation Patient Details Name: ROMESHA SCHERER MRN: [id number] DOB: May 07, 1917 Today's Date: 06/08/2013 Time: 4098-1191 OT Time Calculation (min): 29 min  OT Assessment / Plan / Recommendation History of present illness JAANAI SALEMI is a 80 y.o. female who presents with progressive decline.  She has had progressive loss of ability to walk and progressive mental decline since Sept of last year.  This culminated in a fall yesterday.  Thankfully patient was uninjured in the fall, CT scans of head yesterday and today are negative.   Clinical Impression   Pt demos decline in function and safety with decreased strength, balance and endurance and would benefit from acute OT services to address impairments to increase level of function and safety    OT Assessment  Patient needs continued OT Services    Follow Up Recommendations  SNF;Supervision/Assistance - 24 hour    Barriers to Discharge Decreased caregiver support Pt lives at home alone   Equipment Recommendations       Recommendations for Other Services    Frequency  Min 2X/week    Precautions / Restrictions Precautions Precautions: Fall Precaution Comments: hx of falls Restrictions Weight Bearing Restrictions: No   Pertinent Vitals/Pain 5/10 R UE area    ADL  Grooming: Performed;Wash/dry hands;Wash/dry face;Min guard Where Assessed - Grooming: Supported sitting Upper Body Bathing: Simulated;Min guard Where Assessed - Upper Body Bathing: Supported sitting Lower Body Bathing: Maximal assistance;Simulated Upper Body Dressing: Performed;Min guard Where Assessed - Upper Body Dressing: Supported sitting Lower Body Dressing: +1 Total assistance Toilet Transfer: Performed;Maximal assistance Toilet Transfer Method: Sit to stand;Stand pivot Acupuncturist: Bedside commode Toileting - Clothing Manipulation and Hygiene: Performed;+1 Total assistance Where Assessed - Engineer, mining and  Hygiene: Standing Tub/Shower Transfer Method: Not assessed Equipment Used: Gait belt;Other (comment) (BSC) Transfers/Ambulation Related to ADLs: SPT bed to [facility] to recliner. Pt very unsteady and fearful. Required max verbal and tactile cues    OT Diagnosis: Generalized weakness;Acute pain  OT Problem List: Decreased strength;Decreased knowledge of use of DME or AE;Decreased cognition;Decreased activity tolerance;Impaired balance (sitting and/or standing);Decreased safety awareness;Pain;Decreased knowledge of precautions OT Treatment Interventions: Self-care/ADL training;Therapeutic exercise;Neuromuscular education;Balance training;Patient/family education;Therapeutic activities;DME and/or AE instruction   OT Goals(Current goals can be found in the care plan section) Acute Rehab OT Goals Patient Stated Goal: to go home OT Goal Formulation: With patient Time For Goal Achievement: 06/15/13 Potential to Achieve Goals: Good ADL Goals Pt Will Perform Grooming: with supervision;with set-up;sitting Pt Will Perform Upper Body Bathing: with supervision;with set-up;sitting Pt Will Perform Lower Body Bathing: with mod assist;sitting/lateral leans;sit to/from stand Pt Will Perform Upper Body Dressing: with max assist;with set-up;sitting Pt Will Transfer to Toilet: with mod assist;bedside commode Pt Will Perform Toileting - Clothing Manipulation and hygiene: with max assist;with mod assist;sitting/lateral leans;sit to/from stand  Visit Information  Last OT Received On: 06/08/13 History of Present Illness: AARYN PARRILLA is a 80 y.o. female who presents with progressive decline.  She has had progressive loss of ability to walk and progressive mental decline since Sept of last year.  This culminated in a fall yesterday.  Thankfully patient was uninjured in the fall, CT scans of head yesterday and today are negative.       Prior Functioning     Home Living Family/patient expects to be discharged to::  Skilled nursing facility Living Arrangements: Alone Additional Comments: Pt living alone prior to admission; family reports pt has recently required incr (A) at home and unable to drive; neighbor  has been driving pt to/from store and appointments  Prior Function Level of Independence: Needs assistance ADL's / Homemaking Assistance Needed: Pt reports that she is independent with ADLs Comments: pt has been needing incr (A) for past few months; Communication Communication: HOH Dominant Hand: Right         Vision/Perception Vision - History Baseline Vision: Wears glasses all the time Patient Visual Report: No change from baseline Perception Perception: Within Functional Limits   Cognition  Cognition Arousal/Alertness: Awake/alert Behavior During Therapy: WFL for tasks assessed/performed Overall Cognitive Status: History of cognitive impairments - at baseline Area of Impairment: Memory;Following commands;Safety/judgement Orientation Level: Place;Time Memory: Decreased short-term memory Problem Solving: Slow processing;Decreased initiation;Difficulty sequencing;Requires verbal cues;Requires tactile cues    Extremity/Trunk Assessment Upper Extremity Assessment Upper Extremity Assessment: Generalized weakness;Overall [facility] CenterWFL for tasks assessed Lower Extremity Assessment Lower Extremity Assessment: Defer to PT evaluation Cervical / Trunk Assessment Cervical / Trunk Assessment: Kyphotic     Mobility Bed Mobility Overal bed mobility: Needs Assistance Bed Mobility: Supine to Sit;Sit to Supine Supine to sit: HOB elevated;Min assist General bed mobility comments: pt requires incr time and max directional cues for sequencing; relies heavily on handrails for mobility; (A) to elevate trunk and (A) LEs to sitting position; pt minimally able to advance LEs onto bed but requires (A) to manage trunk Transfers Overall transfer level: Needs assistance Equipment used: None Transfers: Sit to/from  UG[employer] CorporationStand;Stand Pivot Transfers Sit to Stand: Max assist Stand pivot transfers: Max assist General transfer comment: [facility] to [facility] to recliner. Pt very unsteady and fearful. Required max verbal and tactile cues          Balance Balance Overall balance assessment: Needs assistance Sitting-balance support: No upper extremity supported;Feet supported;Single extremity supported Sitting balance-[name]: Fair Postural control: Posterior lean Standing balance support: Bilateral upper extremity supported;During functional activity Standing balance-[name]: Poor   End of Session OT - End of Session Equipment Utilized During Treatment: Gait belt;Other (comment) (BSC) Activity Tolerance: Patient limited by fatigue Patient left: in chair;with call bell/phone within reach;with family/visitor present  GO     Galen ManilaSpencer, Azlyn Wingler Jeanette 06/08/2013, 12:16 PM

## 2013-06-09 LAB — BASIC METABOLIC PANEL
BUN: 7 mg/dL (ref 6–23)
CALCIUM: 7.9 mg/dL — AB (ref 8.4–10.5)
CO2: 23 mEq/L (ref 19–32)
CREATININE: 0.56 mg/dL (ref 0.50–1.10)
Chloride: 90 mEq/L — ABNORMAL LOW (ref 96–112)
GFR, EST NON AFRICAN AMERICAN: 78 mL/min — AB (ref >90)
GLUCOSE: 95 mg/dL (ref 70–99)
Potassium: 4.1 mEq/L (ref 3.7–5.3)
Sodium: 125 mEq/L — ABNORMAL LOW (ref 137–147)

## 2013-06-09 LAB — CBC
HEMATOCRIT: 32.6 % — AB (ref 36.0–46.0)
HEMOGLOBIN: 11.3 g/dL — AB (ref 12.0–15.0)
MCH: 30.2 pg (ref 26.0–34.0)
MCHC: 34.7 g/dL (ref 30.0–36.0)
MCV: 87.2 fL (ref 78.0–100.0)
Platelets: 170 10*3/uL (ref 150–400)
RBC: 3.74 MIL/uL — ABNORMAL LOW (ref 3.87–5.11)
RDW: 15 % (ref 11.5–15.5)
WBC: 4.1 10*3/uL (ref 4.0–10.5)

## 2013-06-09 LAB — URINE CULTURE: Colony Count: >=100000

## 2013-06-09 LAB — OSMOLALITY, URINE: OSMOLALITY UR: 333 mosm/kg — AB (ref 390–1090)

## 2013-06-09 MED ORDER — CEFUROXIME AXETIL 500 MG PO TABS: 500.0000 mg | ORAL_TABLET | Freq: Two times a day (BID) | ORAL | Status: DC

## 2013-06-09 MED ORDER — SORBITOL 70 % SOLN
100.0000 mL | Freq: Once | Status: AC
  Administered 2013-06-09: 100 mL via ORAL
  Filled 2013-06-09: qty 120

## 2013-06-09 MED ORDER — FLEET ENEMA 7-19 GM/118ML RE ENEM
1.0000 | ENEMA | Freq: Every day | RECTAL | Status: DC | PRN
  Filled 2013-06-09: qty 1

## 2013-06-09 MED ORDER — POLYETHYLENE GLYCOL 3350 17 G PO PACK: 17.0000 g | PACK | Freq: Every day | ORAL | Status: DC

## 2013-06-09 NOTE — Progress Notes (Signed)
Pt's left knee swollen and complaining of pain from left hip through left knee when she extends her leg. MD notified.

## 2013-06-09 NOTE — Clinical Social Work Placement (Signed)
Clinical Social Work Department CLINICAL SOCIAL WORK PLACEMENT NOTE 06/09/2013  Patient:  Olivia Nixon,Olivia Nixon  Account Number:  [id number] Admit date:  06/06/2013  Clinical Social Worker:  Cherre BlancJOSEPH BRYANT Audree Schrecengost, [place]CSWA  Date/time:  06/07/2013 01:00 PM  Clinical Social Work is seeking post-discharge placement for this patient at the following level of care:   SKILLED NURSING   (*CSW will update this form in Epic as items are completed)   06/07/2013  [facility]family provided with [facility] of Clinical Social Work's list of facilities offering this level of care within the geographic area requested by the patient (or if unable, by the patient's family).  06/07/2013  Patient/family informed of their freedom to choose among providers that offer the needed level of care, that participate in Medicare, Medicaid or managed care program needed by the patient, have an available bed and are willing to accept the patient.  06/07/2013  Patient/family informed of [facility]' ownership interest in [facility], as well as of the fact that they are under no obligation to receive care at this facility.  PASARR submitted to EDS on 06/07/2013 PASARR number received from EDS on 06/07/2013  FL2 transmitted to all facilities in geographic area requested by pt/family on  06/07/2013 FL2 transmitted to all facilities within larger geographic area on   Patient informed that his/her managed care company has contracts with or will negotiate with  certain facilities, including the following:     Patient/family informed of bed offers received:  06/09/2013 [facility] Physician recommends and patient chooses bed at    [facility]  06/10/2013 Patient to be transferred to facility by Ambulance  The following physician request were entered in Epic:   Additional Comments:    Roddie McBryant Eileen Croswell, Bryon LionsLCSWA, LCASA, [phone]

## 2013-06-09 NOTE — Clinical Social Work Note (Signed)
[facility]. Patient is set up for weekend DC. She will go to the [facility] hall.  Roddie McBryant Deanglo Hissong, [place]CSWA, [place], [phone]

## 2013-06-09 NOTE — Progress Notes (Signed)
Pt son Driscilla GrammesRaymond Haulden wants MD to call him in the morning [phone]. Thanks.

## 2013-06-09 NOTE — Progress Notes (Signed)
[facility] PROGRESS NOTE   Olivia Nixon UVO:[id number] DOB: 02/15/1918 DOA: 06/06/2013 PCP: Colette RibasGOLDING, JOHN CABOT, MD  HPI/Subjective: Seen with sister at bedside, no complaints  Assessment/Plan: Principal Problem:   Generalized weakness Active Problems:   UTI (urinary tract infection)   Declining functional status   Hyponatremia   Generalized weakness -Chronic progressive decline in functional status likely secondary to progressive dementia and advanced age. -Family including 2 sisters feels patient is safe at home and needs placement. -PT/OT recommended skilled nursing facility, CSW to follow.  UTI -Urinalysis consistent with UTI -Patient started on Rocephin, adjust antibiotics according to culture results.  Hyponatremia -Likely secondary to dehydration, slight worsening after starting IV fluids. -Plasma osmolality consistent with hypotonic hyponatremia, oral hydration. Lasix as needed.  Recurrent falls -Patient has frequent falls at home, but was confirmed with her family.  Code Status: Full code Family Communication: Plan discussed with the patient. Disposition Plan: Remains inpatient   Consultants:  None  Procedures:  None  Antibiotics:  Rocephin   Objective: Filed Vitals:   06/09/13 1217  BP: 128/79  Pulse: 85  Temp: 98.7 F (37.1 C)  Resp: 18    Intake/Output Summary (Last 24 hours) at 06/09/13 1233 Last data filed at 06/08/13 1630  Gross per 24 hour  Intake   1084 ml  Output      2 ml  Net   1082 ml   Filed Weights   06/06/13 1915  Weight: 52.164 kg (115 lb)    Exam: General: Alert and awake, oriented x3, not in any acute distress. HEENT: anicteric sclera, pupils reactive to light and accommodation, EOMI CVS: S1-S2 clear, no murmur rubs or gallops Chest: clear to auscultation bilaterally, no wheezing, rales or rhonchi Abdomen: soft nontender, nondistended, normal bowel sounds, no organomegaly Extremities: no cyanosis,  clubbing or edema noted bilaterally Neuro: Cranial nerves II-XII intact, no focal neurological deficits  Data Reviewed: Basic Metabolic Panel:  Recent Labs Lab 06/06/13 1922 06/06/13 2021 06/07/13 0544 06/08/13 0425 06/09/13 0610  NA 128* 127* 128* 125* 125*  K 3.9 3.7 4.2 4.1 4.1  CL 89* 90* 93* 90* 90*  CO2 23  --  22 23 23   GLUCOSE 103* 106* 98 91 95  BUN 13 14 12 9 7   CREATININE 0.62 0.70 0.59 0.56 0.56  CALCIUM 8.8  --  8.3* 7.8* 7.9*   Liver Function Tests: No results found for this basename: AST, ALT, ALKPHOS, BILITOT, PROT, ALBUMIN,  in the last 168 hours No results found for this basename: LIPASE, AMYLASE,  in the last 168 hours No results found for this basename: AMMONIA,  in the last 168 hours CBC:  Recent Labs Lab 06/06/13 1922 06/06/13 2021 06/08/13 0425 06/09/13 0610  WBC 6.5  --  4.3 4.1  HGB 13.1 13.9 11.1* 11.3*  HCT 37.6 41.0 32.2* 32.6*  MCV 89.3  --  88.5 87.2  PLT 197  --  185 170   Cardiac Enzymes:  Recent Labs Lab 06/06/13 2245  CKTOTAL 430*  TROPONINI <0.30   BNP (last 3 results) No results found for this basename: PROBNP,  in the last 8760 hours CBG:  Recent Labs Lab 06/07/13 0128  GLUCAP 97    Micro Recent Results (from the past 240 hour(s))  URINE CULTURE     Status: None   Collection Time    06/06/13 11:36 PM      Result Value Ref Range Status   Specimen Description URINE, CLEAN CATCH   Final  Special Requests NONE   Final   Culture  Setup Time     Final   Value: 06/07/2013 00:17     Performed at [facility] Count     Final   Value: >=100,000 COLONIES/ML     Performed at [facility]   Culture     Final   Value: GRAM NEGATIVE RODS     Performed at [facility]   Report Status PENDING   Incomplete     Studies: No results found.  Scheduled Meds: . acetaminophen  650 mg Oral Daily  . aspirin EC  81 mg Oral Daily  . cefTRIAXone (ROCEPHIN)  IV  1 g Intravenous Q24H  . feeding  supplement (ENSURE COMPLETE)  237 mL Oral BID BM  . fluticasone  1 spray Each Nare Daily  . heparin  5,000 Units Subcutaneous Q12H  . irbesartan  300 mg Oral Daily  . levothyroxine  50 mcg Oral QAC breakfast  . loratadine  10 mg Oral Daily  . metoprolol  100 mg Oral BID  . polyethylene glycol  17 g Oral Daily  . sorbitol  100 mL Oral Once   Continuous Infusions: . sodium chloride 80 mL/hr at 06/09/13 1045       Time spent: 35 minutes    [facility] A  [facility] Pager [phone] If 7PM-7AM, please contact night-coverage at [email or web address], password [facility] 06/09/2013, 12:33 PM  LOS: 3 days

## 2013-06-09 NOTE — Progress Notes (Signed)
Physical Therapy Treatment Patient Details Name: Olivia Nixon MRN: [id number] DOB: July 23, 1917 Today's Date: 06/09/2013 Time: 4098-1191 PT Time Calculation (min): 63 min  PT Assessment / Plan / Recommendation  History of Present Illness Olivia Nixon is a 80 y.o. female who presents with progressive decline.  She has had progressive loss of ability to walk and progressive mental decline since Sept of last year.  This culminated in a fall yesterday.  CT scans of head yesterday and today are negative.   PT Comments   Pt remains very fearful of falling and requires much encouragement and reassurance to participate. Pt much less fearful when using [name] to mobilize.  Follow Up Recommendations  SNF     Does the patient have the potential to tolerate intense rehabilitation     Barriers to Discharge        Equipment Recommendations  None recommended by PT    Recommendations for Other Services    Frequency Min 2X/week   Progress towards PT Goals Progress towards PT goals: Progressing toward goals  Plan Current plan remains appropriate    Precautions / Restrictions Precautions Precautions: Fall Restrictions Weight Bearing Restrictions: No   Pertinent Vitals/Pain Reports soreness in rt hip.    Mobility  Bed Mobility Overal bed mobility: Needs Assistance Bed Mobility: Rolling;Supine to Sit Rolling: Min assist Supine to sit: Min assist;HOB elevated General bed mobility comments: Pt required max encouragement to participate.  Assist to bring trunk up and hips to EOB. Transfers Overall transfer level: Needs assistance Equipment used: Rolling walker (2 wheeled) [name] Transfers: Sit to/from [employer] Stand pivot transfers: Mod assist;Max assist;+2 physical assistance General transfer comment: On first stand pt used rolling walker and required +2 max A due to heavy posterior lean and fear of falling. On second attempt pt used forearm support of PT and tech and  required +2 max A and pivoted to chair with heavy posterior lean and difficulty pivoting feet. When using [name] pt able to stand from the recliner with +1 mod A.  Pt performed sit to stand with [name] 7 times.    Exercises     PT Diagnosis:    PT Problem List:   PT Treatment Interventions:     PT Goals (current goals can now be found in the care plan section)    Visit Information  Last PT Received On: 06/09/13 Assistance Needed: +2 History of Present Illness: Olivia Nixon is a 80 y.o. female who presents with progressive decline.  She has had progressive loss of ability to walk and progressive mental decline since Sept of last year.  This culminated in a fall yesterday.  CT scans of head yesterday and today are negative.    Subjective Data      Cognition  Cognition Arousal/Alertness: Awake/alert Behavior During Therapy: WFL for tasks assessed/performed Overall Cognitive Status: History of cognitive impairments - at baseline Area of Impairment: Memory;Following commands;Safety/judgement Memory: Decreased short-term memory Following Commands: Follows one step commands consistently Safety/Judgement: Decreased awareness of deficits;Decreased awareness of safety Problem Solving: Slow processing;Decreased initiation;Difficulty sequencing;Requires verbal cues;Requires tactile cues General Comments: Pt with decr logical thought.    Balance  Balance Standing balance support: Bilateral upper extremity supported Standing balance-[name]: Poor Standing balance comment: Stood x 7 with steady for 1-3 minutes each time. Pt initially required mod A to maintain standing with stedy but with practice improved to requiring only min A. Much less fearful using [name] than using walker or no device.  End  of Session PT - End of Session Equipment Utilized During Treatment: Gait belt;Other (comment) Antony Salmon([name]) Activity Tolerance: Patient tolerated treatment well Patient left: in chair;with call  bell/phone within reach;with family/visitor present Nurse Communication: Mobility status;Need for lift equipment   GP     Bob Eastwood 06/09/2013, 2:24 PM  [facility] PT [phone]

## 2013-06-09 NOTE — Progress Notes (Signed)
Pt had emesis following drinking sorbitol. Pt requesting suppository. MD notified.

## 2013-06-09 NOTE — Discharge Summary (Signed)
Physician Discharge Summary  BRITENY FULGHUM Nixon:[id number] DOB: July 10, 1917 DOA: 06/06/2013  PCP: Colette Ribas, MD  Admit date: 06/06/2013 Discharge date: 06/09/2013  Time spent: 40 minutes  Recommendations for Outpatient Follow-up:  1. Followup with primary care physician within 2 weeks. 2. Check BMP in one week.  Discharge Diagnoses:  Principal Problem:   Generalized weakness Active Problems:   UTI (urinary tract infection)   Declining functional status   Hyponatremia   Discharge Condition: Stable  Diet recommendation: Heart healthy  Filed Weights   06/06/13 1915  Weight: 52.164 kg (115 lb)    History of present illness:  Olivia Nixon is a 80 y.o. female who presents with progressive decline. She has had progressive loss of ability to walk and progressive mental decline since Sept of last year. This culminated in a fall yesterday. Thankfully patient was uninjured in the fall, CT scans of head yesterday and today are negative. Family feels patient is unsafe at home as evidenced by recurring falls, and also burn wounds on arms from cooking.  Hospital Course:   Generalized weakness  -Chronic progressive decline in functional status likely secondary to progressive dementia and advanced age.  -Family including 2 sisters feels patient is safe at home and needs placement.  -PT/OT recommended skilled nursing facility.  UTI  -Urinalysis consistent with UTI  -Patient started on Rocephin, patient discharged on Ceftin for 3 more days.   Hyponatremia  -Likely secondary to dehydration, slight worsening after starting IV fluids.  -Plasma osmolality consistent with hypotonic hyponatremia, has low urine asthm osmolality. -Normal TSH, this is likely secondary to stress, Lasix given.  Recurrent falls  -Patient has frequent falls at home, but was confirmed with her family.   Procedures:  None  Consultations:  None  Discharge Exam: Filed Vitals:   06/09/13 1217  BP:  128/79  Pulse: 85  Temp: 98.7 F (37.1 C)  Resp: 18   General: Alert and awake, oriented x3, not in any acute distress. HEENT: anicteric sclera, pupils reactive to light and accommodation, EOMI CVS: S1-S2 clear, no murmur rubs or gallops Chest: clear to auscultation bilaterally, no wheezing, rales or rhonchi Abdomen: soft nontender, nondistended, normal bowel sounds, no organomegaly Extremities: no cyanosis, clubbing or edema noted bilaterally Neuro: Cranial nerves II-XII intact, no focal neurological deficits  Discharge Instructions     Medication List         aspirin 81 MG tablet  Take 81 mg by mouth daily.     cefUROXime 500 MG tablet  Commonly known as:  CEFTIN  Take 1 tablet (500 mg total) by mouth 2 (two) times daily with a meal.     fluticasone 50 MCG/ACT nasal spray  Commonly known as:  FLONASE  Place 1 spray into both nostrils daily.     levocetirizine 5 MG tablet  Commonly known as:  XYZAL  Take 5 mg by mouth daily.     levothyroxine 50 MCG tablet  Commonly known as:  SYNTHROID, LEVOTHROID  Take 50 mcg by mouth daily.     metoprolol 100 MG tablet  Commonly known as:  LOPRESSOR  Take 100 mg by mouth 2 (two) times daily.     olmesartan 40 MG tablet  Commonly known as:  BENICAR  Take 40 mg by mouth daily.     polyethylene glycol packet  Commonly known as:  MIRALAX / GLYCOLAX  Take 17 g by mouth daily.     TYLENOL ARTHRITIS PAIN 650 MG CR tablet  Generic  drug:  acetaminophen  Take 650 mg by mouth every morning.       Allergies  Allergen Reactions  . Amoxicillin   . Bisacodyl   . Celebrex [Celecoxib]   . Chlorthalidone   . Ciprofloxacin   . Codeine   . Darvocet [Propoxyphene N-Acetaminophen]   . Diclofenac Sodium   . Flagyl [Metronidazole Hcl]   . Ibuprofen   . Iodine   . Meloxicam   . Methocarbamol   . Oruvail [Ketoprofen]   . Ropinirole Hcl   . Sulfa Antibiotics   . Valium   . Zolpidem Tartrate        Follow-up Information    Follow up with Colette Ribas, MD In 2 weeks.   Specialty:  Family Medicine   Contact information:   [address] [place] [address] [phone]        The results of significant diagnostics from this hospitalization (including imaging, microbiology, ancillary and laboratory) are listed below for reference.    Significant Diagnostic Studies: Dg Chest 2 View  06/06/2013   CLINICAL DATA:  Fall.  Right-sided pain.  EXAM: CHEST  2 VIEW  COMPARISON:  Right rib films same date. 06/03/2005 chest x-ray. 06/05/2005 chest CT.  FINDINGS: Remote right eighth rib fracture. No acute rib fracture or pneumothorax is noted.  Blunting posterior sulcus on lateral view. This may represent scarring although small right-sided pleural effusion not excluded  Scoliosis and degenerative changes without obvious compression fracture.  Post CABG.  Cardiomegaly.  Calcified tortuous aorta.  Basilar subsegmental atelectasis.  IMPRESSION: Remote right eighth rib fracture. No acute rib fracture or pneumothorax is noted.  Blunting posterior sulcus on lateral view. This may represent scarring although small right-sided pleural effusion not excluded  Scoliosis and degenerative changes without obvious compression fracture.  Post CABG.  Cardiomegaly.  Calcified tortuous aorta.  Basilar subsegmental atelectasis.   Electronically Signed   By: Bridgett Larsson M.D.   On: 06/06/2013 22:34   Dg Ribs Unilateral Right  06/06/2013   CLINICAL DATA:  Fall from standing position. Pain right-sided chest.  EXAM: RIGHT RIBS - 2 VIEW  COMPARISON:  None.  FINDINGS: Remote right eighth rib fracture. No acute rib fracture or pneumothorax is noted.  There may be a small right-sided pleural effusion.  If it would change the patient's course of therapy to detect a subtle rib fracture, then CT the chest may be considered.  IMPRESSION: Remote right eighth rib fracture. No acute rib fracture or pneumothorax is noted.  There may be  a small right-sided pleural effusion.  If it would change the patient's course of therapy to detect a subtle rib fracture, then CT the chest may be considered.   Electronically Signed   By: Bridgett Larsson M.D.   On: 06/06/2013 22:36   Ct Head Wo Contrast  06/06/2013   CLINICAL DATA:  Weakness.  Fall.  Hypertension.  EXAM: CT HEAD WITHOUT CONTRAST  TECHNIQUE: Contiguous axial images were obtained from the base of the skull through the vertex without intravenous contrast.  COMPARISON:  06/05/2013.  FINDINGS: No skull fracture or intracranial hemorrhage.  Left parietal 8 mm calvaria lucency may represent a small hemangioma. Slight bony overgrowth anterior superior margin mastoid region felt to be an incidental finding possibly very small en plaque meningiomas.  Prominent vascular calcifications.  Small vessel disease type changes without CT evidence of large acute infarct.  Atrophy. Ventricular prominence probably related to atrophy rather than hydrocephalus and unchanged.  IMPRESSION: No skull fracture or intracranial hemorrhage.  Small vessel disease type changes without CT evidence of large acute infarct.  Atrophy. Ventricular prominence probably related to atrophy rather than hydrocephalus and unchanged.   Electronically Signed   By: Bridgett Larsson M.D.   On: 06/06/2013 20:09   Ct Head Wo Contrast  06/05/2013   CLINICAL DATA:  Fall.  Left-sided neck pain.  EXAM: CT HEAD WITHOUT CONTRAST  CT CERVICAL SPINE WITHOUT CONTRAST  TECHNIQUE: Multidetector CT imaging of the head and cervical spine was performed following the standard protocol without intravenous contrast. Multiplanar CT image reconstructions of the cervical spine were also generated.  COMPARISON:  None.  FINDINGS: CT HEAD FINDINGS  No skull fracture or intracranial hemorrhage.  Atrophy. Ventricular prominence probably related to atrophy rather than hydrocephalus.  No CT evidence of large acute infarct.  No intracranial mass lesion noted on this unenhanced  exam.  Vascular calcifications.  Partial opacification right sphenoid sinus air cell.  CT CERVICAL SPINE FINDINGS  No cervical spine fracture.  Anterior slippage of C4 felt to be related to degenerative changes.  Cervical Spondylotic changes most prominent C5-6 and C6-7. No abnormal prevertebral soft tissue swelling. If ligamentous injury or cord injury were of high clinical concern, MR imaging could be obtained for further delineation.  Enlarged left thyroid gland with substernal extension measuring up to 3 cm. This can be followed with thyroid ultrasound.  IMPRESSION: CT HEAD :  No skull fracture or intracranial hemorrhage.  Atrophy.  Partial opacification right sphenoid sinus air cell.  CT CERVICAL SPINE:  No cervical spine fracture.  Anterior slippage of C4 felt to be related to degenerative changes.  Cervical Spondylotic changes most prominent C5-6 and C6-7. No abnormal prevertebral soft tissue swelling. If ligamentous injury or cord injury were of high clinical concern, MR imaging could be obtained for further delineation.  Enlarged left thyroid gland with substernal extension measuring up to 3 cm. This can be followed with thyroid ultrasound.   Electronically Signed   By: Bridgett Larsson M.D.   On: 06/05/2013 22:44   Ct Cervical Spine Wo Contrast  06/05/2013   CLINICAL DATA:  Fall.  Left-sided neck pain.  EXAM: CT HEAD WITHOUT CONTRAST  CT CERVICAL SPINE WITHOUT CONTRAST  TECHNIQUE: Multidetector CT imaging of the head and cervical spine was performed following the standard protocol without intravenous contrast. Multiplanar CT image reconstructions of the cervical spine were also generated.  COMPARISON:  None.  FINDINGS: CT HEAD FINDINGS  No skull fracture or intracranial hemorrhage.  Atrophy. Ventricular prominence probably related to atrophy rather than hydrocephalus.  No CT evidence of large acute infarct.  No intracranial mass lesion noted on this unenhanced exam.  Vascular calcifications.  Partial  opacification right sphenoid sinus air cell.  CT CERVICAL SPINE FINDINGS  No cervical spine fracture.  Anterior slippage of C4 felt to be related to degenerative changes.  Cervical Spondylotic changes most prominent C5-6 and C6-7. No abnormal prevertebral soft tissue swelling. If ligamentous injury or cord injury were of high clinical concern, MR imaging could be obtained for further delineation.  Enlarged left thyroid gland with substernal extension measuring up to 3 cm. This can be followed with thyroid ultrasound.  IMPRESSION: CT HEAD :  No skull fracture or intracranial hemorrhage.  Atrophy.  Partial opacification right sphenoid sinus air cell.  CT CERVICAL SPINE:  No cervical spine fracture.  Anterior slippage of C4 felt to be related to degenerative changes.  Cervical Spondylotic changes most prominent  C5-6 and C6-7. No abnormal prevertebral soft tissue swelling. If ligamentous injury or cord injury were of high clinical concern, MR imaging could be obtained for further delineation.  Enlarged left thyroid gland with substernal extension measuring up to 3 cm. This can be followed with thyroid ultrasound.   Electronically Signed   By: Bridgett LarssonSteve  Olson M.D.   On: 06/05/2013 22:44    Microbiology: Recent Results (from the past 240 hour(s))  URINE CULTURE     Status: None   Collection Time    06/06/13 11:36 PM      Result Value Ref Range Status   Specimen Description URINE, CLEAN CATCH   Final   Special Requests NONE   Final   Culture  Setup Time     Final   Value: 06/07/2013 00:17     [facility]formed at [facility]   Colony Count     Final   Value: >=100,000 COLONIES/ML     [facility]formed at [facility]   Culture     Final   Value: GRAM NEGATIVE RODS     Performed at [facility]   Report Status PENDING   Incomplete     Labs: Basic Metabolic Panel:  Recent Labs Lab 06/06/13 1922 06/06/13 2021 06/07/13 0544 06/08/13 0425 06/09/13 0610  NA 128* 127* 128* 125* 125*  K 3.9  3.7 4.2 4.1 4.1  CL 89* 90* 93* 90* 90*  CO2 23  --  22 23 23   GLUCOSE 103* 106* 98 91 95  BUN 13 14 12 9 7   CREATININE 0.62 0.70 0.59 0.56 0.56  CALCIUM 8.8  --  8.3* 7.8* 7.9*   Liver Function Tests: No results found for this basename: AST, ALT, ALKPHOS, BILITOT, PROT, ALBUMIN,  in the last 168 hours No results found for this basename: LIPASE, AMYLASE,  in the last 168 hours No results found for this basename: AMMONIA,  in the last 168 hours CBC:  Recent Labs Lab 06/06/13 1922 06/06/13 2021 06/08/13 0425 06/09/13 0610  WBC 6.5  --  4.3 4.1  HGB 13.1 13.9 11.1* 11.3*  HCT 37.6 41.0 32.2* 32.6*  MCV 89.3  --  88.5 87.2  PLT 197  --  185 170   Cardiac Enzymes:  Recent Labs Lab 06/06/13 2245  CKTOTAL 430*  TROPONINI <0.30   BNP: BNP (last 3 results) No results found for this basename: PROBNP,  in the last 8760 hours CBG:  Recent Labs Lab 06/07/13 0128  GLUCAP 97       Signed:  Penelope Fittro A  [facility] 06/09/2013, 12:35 PM

## 2013-06-10 MED ORDER — HYDRALAZINE HCL 20 MG/ML IJ SOLN
10.0000 mg | Freq: Four times a day (QID) | INTRAMUSCULAR | Status: DC | PRN
  Filled 2013-06-10: qty 1

## 2013-06-10 MED ORDER — AMLODIPINE BESYLATE 5 MG PO TABS: 5.0000 mg | ORAL_TABLET | Freq: Every day | ORAL | Status: DC

## 2013-06-10 MED ORDER — HYDRALAZINE HCL 20 MG/ML IJ SOLN
5.0000 mg | Freq: Once | INTRAMUSCULAR | Status: AC
  Administered 2013-06-10: 5 mg via INTRAVENOUS

## 2013-06-10 MED ORDER — HYDRALAZINE HCL 20 MG/ML IJ SOLN
5.0000 mg | Freq: Four times a day (QID) | INTRAMUSCULAR | Status: DC | PRN
  Administered 2013-06-10: 5 mg via INTRAVENOUS
  Filled 2013-06-10: qty 1

## 2013-06-10 NOTE — Discharge Summary (Signed)
Physician Discharge Summary  SHANECA ORNE WUJ:[id number] DOB: 1917-06-23 DOA: 06/06/2013  PCP: Colette Ribas, MD  Admit date: 06/06/2013 Discharge date: 06/10/2013  Time spent: 40 minutes  Recommendations for Outpatient Follow-up:  1. Followup with primary care physician within 2 weeks. 2. Check BMP in one week.  Discharge Diagnoses:  Principal Problem:   Generalized weakness Active Problems:   UTI (urinary tract infection)   Declining functional status   Hyponatremia   Discharge Condition: Stable  Diet recommendation: Heart healthy  Filed Weights   06/06/13 1915  Weight: 52.164 kg (115 lb)    History of present illness:  KATIANA RULAND is a 80 y.o. female who presents with progressive decline. She has had progressive loss of ability to walk and progressive mental decline since Sept of last year. This culminated in a fall yesterday. Thankfully patient was uninjured in the fall, CT scans of head yesterday and today are negative. Family feels patient is unsafe at home as evidenced by recurring falls, and also burn wounds on arms from cooking.  Hospital Course:   Generalized weakness  -Chronic progressive decline in functional status likely secondary to progressive dementia and advanced age.  -Family including 2 sisters feels patient is safe at home and needs placement.  -PT/OT recommended skilled nursing facility.  UTI  -Urinalysis consistent with UTI secondary to Morganella morganii -Patient started on Rocephin, patient discharged on Ceftin for 3 more days.   Hyponatremia  -Likely secondary to dehydration, slight worsening after starting IV fluids.  -Plasma osmolality consistent with hypotonic hyponatremia, has low urine osmolality. -Normal TSH, Follow closely, this is seems to be chronic.  Recurrent falls  -Patient has frequent falls at home, but was confirmed with her family.  Hypertension -Patient is on metoprolol and Olmesartan. Blood pressure still in the high  side. -Amlodipine 5 mg added.   Procedures:  None  Consultations:  None  Discharge Exam: Filed Vitals:   06/10/13 1004  BP: 168/78  Pulse: 84  Temp:   Resp:    General: Alert and awake, oriented x3, not in any acute distress. HEENT: anicteric sclera, pupils reactive to light and accommodation, EOMI CVS: S1-S2 clear, no murmur rubs or gallops Chest: clear to auscultation bilaterally, no wheezing, rales or rhonchi Abdomen: soft nontender, nondistended, normal bowel sounds, no organomegaly Extremities: no cyanosis, clubbing or edema noted bilaterally Neuro: Cranial nerves II-XII intact, no focal neurological deficits  Discharge Instructions      Discharge Orders   Future Orders Complete By Expires   Increase activity slowly  As directed        Medication List         amLODipine 5 MG tablet  Commonly known as:  NORVASC  Take 1 tablet (5 mg total) by mouth daily.     aspirin 81 MG tablet  Take 81 mg by mouth daily.     cefUROXime 500 MG tablet  Commonly known as:  CEFTIN  Take 1 tablet (500 mg total) by mouth 2 (two) times daily with a meal.     fluticasone 50 MCG/ACT nasal spray  Commonly known as:  FLONASE  Place 1 spray into both nostrils daily.     levocetirizine 5 MG tablet  Commonly known as:  XYZAL  Take 5 mg by mouth daily.     levothyroxine 50 MCG tablet  Commonly known as:  SYNTHROID, LEVOTHROID  Take 50 mcg by mouth daily.     metoprolol 100 MG tablet  Commonly known as:  LOPRESSOR  Take 100 mg by mouth 2 (two) times daily.     olmesartan 40 MG tablet  Commonly known as:  BENICAR  Take 40 mg by mouth daily.     polyethylene glycol packet  Commonly known as:  MIRALAX / GLYCOLAX  Take 17 g by mouth daily.     TYLENOL ARTHRITIS PAIN 650 MG CR tablet  Generic drug:  acetaminophen  Take 650 mg by mouth every morning.       Allergies  Allergen Reactions  . Amoxicillin   . Bisacodyl   . Celebrex [Celecoxib]   . Chlorthalidone   .  Ciprofloxacin   . Codeine   . Darvocet [Propoxyphene N-Acetaminophen]   . Diclofenac Sodium   . Flagyl [Metronidazole Hcl]   . Ibuprofen   . Iodine   . Meloxicam   . Methocarbamol   . Oruvail [Ketoprofen]   . Ropinirole Hcl   . Sulfa Antibiotics   . Valium   . Zolpidem Tartrate    Follow-up Information   Follow up with Colette Ribas, MD In 2 weeks.   Specialty:  Family Medicine   Contact information:   [address] [place] [address] [phone]        The results of significant diagnostics from this hospitalization (including imaging, microbiology, ancillary and laboratory) are listed below for reference.    Significant Diagnostic Studies: Dg Chest 2 View  06/06/2013   CLINICAL DATA:  Fall.  Right-sided pain.  EXAM: CHEST  2 VIEW  COMPARISON:  Right rib films same date. 06/03/2005 chest x-ray. 06/05/2005 chest CT.  FINDINGS: Remote right eighth rib fracture. No acute rib fracture or pneumothorax is noted.  Blunting posterior sulcus on lateral view. This may represent scarring although small right-sided pleural effusion not excluded  Scoliosis and degenerative changes without obvious compression fracture.  Post CABG.  Cardiomegaly.  Calcified tortuous aorta.  Basilar subsegmental atelectasis.  IMPRESSION: Remote right eighth rib fracture. No acute rib fracture or pneumothorax is noted.  Blunting posterior sulcus on lateral view. This may represent scarring although small right-sided pleural effusion not excluded  Scoliosis and degenerative changes without obvious compression fracture.  Post CABG.  Cardiomegaly.  Calcified tortuous aorta.  Basilar subsegmental atelectasis.   Electronically Signed   By: Bridgett Larsson M.D.   On: 06/06/2013 22:34   Dg Ribs Unilateral Right  06/06/2013   CLINICAL DATA:  Fall from standing position. Pain right-sided chest.  EXAM: RIGHT RIBS - 2 VIEW  COMPARISON:  None.  FINDINGS: Remote right eighth rib fracture. No acute  rib fracture or pneumothorax is noted.  There may be a small right-sided pleural effusion.  If it would change the patient's course of therapy to detect a subtle rib fracture, then CT the chest may be considered.  IMPRESSION: Remote right eighth rib fracture. No acute rib fracture or pneumothorax is noted.  There may be a small right-sided pleural effusion.  If it would change the patient's course of therapy to detect a subtle rib fracture, then CT the chest may be considered.   Electronically Signed   By: Bridgett Larsson M.D.   On: 06/06/2013 22:36   Ct Head Wo Contrast  06/06/2013   CLINICAL DATA:  Weakness.  Fall.  Hypertension.  EXAM: CT HEAD WITHOUT CONTRAST  TECHNIQUE: Contiguous axial images were obtained from the base of the skull through the vertex without intravenous contrast.  COMPARISON:  06/05/2013.  FINDINGS: No skull fracture or intracranial  hemorrhage.  Left parietal 8 mm calvaria lucency may represent a small hemangioma. Slight bony overgrowth anterior superior margin mastoid region felt to be an incidental finding possibly very small en plaque meningiomas.  Prominent vascular calcifications.  Small vessel disease type changes without CT evidence of large acute infarct.  Atrophy. Ventricular prominence probably related to atrophy rather than hydrocephalus and unchanged.  IMPRESSION: No skull fracture or intracranial hemorrhage.  Small vessel disease type changes without CT evidence of large acute infarct.  Atrophy. Ventricular prominence probably related to atrophy rather than hydrocephalus and unchanged.   Electronically Signed   By: Bridgett Larsson M.D.   On: 06/06/2013 20:09   Ct Head Wo Contrast  06/05/2013   CLINICAL DATA:  Fall.  Left-sided neck pain.  EXAM: CT HEAD WITHOUT CONTRAST  CT CERVICAL SPINE WITHOUT CONTRAST  TECHNIQUE: Multidetector CT imaging of the head and cervical spine was performed following the standard protocol without intravenous contrast. Multiplanar CT image reconstructions  of the cervical spine were also generated.  COMPARISON:  None.  FINDINGS: CT HEAD FINDINGS  No skull fracture or intracranial hemorrhage.  Atrophy. Ventricular prominence probably related to atrophy rather than hydrocephalus.  No CT evidence of large acute infarct.  No intracranial mass lesion noted on this unenhanced exam.  Vascular calcifications.  Partial opacification right sphenoid sinus air cell.  CT CERVICAL SPINE FINDINGS  No cervical spine fracture.  Anterior slippage of C4 felt to be related to degenerative changes.  Cervical Spondylotic changes most prominent C5-6 and C6-7. No abnormal prevertebral soft tissue swelling. If ligamentous injury or cord injury were of high clinical concern, MR imaging could be obtained for further delineation.  Enlarged left thyroid gland with substernal extension measuring up to 3 cm. This can be followed with thyroid ultrasound.  IMPRESSION: CT HEAD :  No skull fracture or intracranial hemorrhage.  Atrophy.  Partial opacification right sphenoid sinus air cell.  CT CERVICAL SPINE:  No cervical spine fracture.  Anterior slippage of C4 felt to be related to degenerative changes.  Cervical Spondylotic changes most prominent C5-6 and C6-7. No abnormal prevertebral soft tissue swelling. If ligamentous injury or cord injury were of high clinical concern, MR imaging could be obtained for further delineation.  Enlarged left thyroid gland with substernal extension measuring up to 3 cm. This can be followed with thyroid ultrasound.   Electronically Signed   By: Bridgett Larsson M.D.   On: 06/05/2013 22:44   Ct Cervical Spine Wo Contrast  06/05/2013   CLINICAL DATA:  Fall.  Left-sided neck pain.  EXAM: CT HEAD WITHOUT CONTRAST  CT CERVICAL SPINE WITHOUT CONTRAST  TECHNIQUE: Multidetector CT imaging of the head and cervical spine was performed following the standard protocol without intravenous contrast. Multiplanar CT image reconstructions of the cervical spine were also generated.   COMPARISON:  None.  FINDINGS: CT HEAD FINDINGS  No skull fracture or intracranial hemorrhage.  Atrophy. Ventricular prominence probably related to atrophy rather than hydrocephalus.  No CT evidence of large acute infarct.  No intracranial mass lesion noted on this unenhanced exam.  Vascular calcifications.  Partial opacification right sphenoid sinus air cell.  CT CERVICAL SPINE FINDINGS  No cervical spine fracture.  Anterior slippage of C4 felt to be related to degenerative changes.  Cervical Spondylotic changes most prominent C5-6 and C6-7. No abnormal prevertebral soft tissue swelling. If ligamentous injury or cord injury were of high clinical concern, MR imaging could be obtained for further delineation.  Enlarged left thyroid gland  with substernal extension measuring up to 3 cm. This can be followed with thyroid ultrasound.  IMPRESSION: CT HEAD :  No skull fracture or intracranial hemorrhage.  Atrophy.  Partial opacification right sphenoid sinus air cell.  CT CERVICAL SPINE:  No cervical spine fracture.  Anterior slippage of C4 felt to be related to degenerative changes.  Cervical Spondylotic changes most prominent C5-6 and C6-7. No abnormal prevertebral soft tissue swelling. If ligamentous injury or cord injury were of high clinical concern, MR imaging could be obtained for further delineation.  Enlarged left thyroid gland with substernal extension measuring up to 3 cm. This can be followed with thyroid ultrasound.   Electronically Signed   By: Bridgett LarssonSteve  Olson M.D.   On: 06/05/2013 22:44    Microbiology: Recent Results (from the past 240 hour(s))  URINE CULTURE     Status: None   Collection Time    06/06/13 11:36 PM      Result Value Ref Range Status   Specimen Description URINE, CLEAN CATCH   Final   Special Requests NONE   Final   Culture  Setup Time     Final   Value: 06/07/2013 00:17     Performed at [facility]   Colony Count     Final   Value: >=100,000 COLONIES/ML     Performed at  [facility]   Culture     Final   Value: [facility]     Performed at [facility]   Report Status 06/09/2013 FINAL   Final   Organism ID, Bacteria [facility]   Final     Labs: Basic Metabolic Panel:  Recent Labs Lab 06/06/13 1922 06/06/13 2021 06/07/13 0544 06/08/13 0425 06/09/13 0610  NA 128* 127* 128* 125* 125*  K 3.9 3.7 4.2 4.1 4.1  CL 89* 90* 93* 90* 90*  CO2 23  --  22 23 23   GLUCOSE 103* 106* 98 91 95  BUN 13 14 12 9 7   CREATININE 0.62 0.70 0.59 0.56 0.56  CALCIUM 8.8  --  8.3* 7.8* 7.9*   Liver Function Tests: No results found for this basename: AST, ALT, ALKPHOS, BILITOT, PROT, ALBUMIN,  in the last 168 hours No results found for this basename: LIPASE, AMYLASE,  in the last 168 hours No results found for this basename: AMMONIA,  in the last 168 hours CBC:  Recent Labs Lab 06/06/13 1922 06/06/13 2021 06/08/13 0425 06/09/13 0610  WBC 6.5  --  4.3 4.1  HGB 13.1 13.9 11.1* 11.3*  HCT 37.6 41.0 32.2* 32.6*  MCV 89.3  --  88.5 87.2  PLT 197  --  185 170   Cardiac Enzymes:  Recent Labs Lab 06/06/13 2245  CKTOTAL 430*  TROPONINI <0.30   BNP: BNP (last 3 results) No results found for this basename: PROBNP,  in the last 8760 hours CBG:  Recent Labs Lab 06/07/13 0128  GLUCAP 97       Signed:  Adea Geisel A  [facility] 06/10/2013, 10:33 AM

## 2013-06-10 NOTE — Progress Notes (Signed)
Olivia Nixon to be D/Nixon'd Skilled nursing facility per MD order.  Discussed with the patient and all questions fully answered.    Medication List         amLODipine 5 MG tablet  Commonly known as:  NORVASC  Take 1 tablet (5 mg total) by mouth daily.     aspirin 81 MG tablet  Take 81 mg by mouth daily.     cefUROXime 500 MG tablet  Commonly known as:  CEFTIN  Take 1 tablet (500 mg total) by mouth 2 (two) times daily with a meal.     fluticasone 50 MCG/ACT nasal spray  Commonly known as:  FLONASE  Place 1 spray into both nostrils daily.     levocetirizine 5 MG tablet  Commonly known as:  XYZAL  Take 5 mg by mouth daily.     levothyroxine 50 MCG tablet  Commonly known as:  SYNTHROID, LEVOTHROID  Take 50 mcg by mouth daily.     metoprolol 100 MG tablet  Commonly known as:  LOPRESSOR  Take 100 mg by mouth 2 (two) times daily.     olmesartan 40 MG tablet  Commonly known as:  BENICAR  Take 40 mg by mouth daily.     polyethylene glycol packet  Commonly known as:  MIRALAX / GLYCOLAX  Take 17 g by mouth daily.     TYLENOL ARTHRITIS PAIN 650 MG CR tablet  Generic drug:  acetaminophen  Take 650 mg by mouth every morning.        VVS. No pressure sores noted. Right upper arm burn, dressing clean dry and intact. IV catheter discontinued intact. Site without signs and symptoms of complications. Dressing and pressure applied.  Patient escorted via EMS.  [name], Olivia Nixon 06/10/2013 1:44 PM

## 2013-06-10 NOTE — Clinical Social Work Note (Signed)
Ambulance transport requested for 1:00 PM today. Patient and sister notified. Full placement note to come.  Roddie McBryant Rudene Poulsen, Durham[place]WA, [place], [phone]

## 2013-06-10 NOTE — Progress Notes (Signed)
12:48 PM Report given Meyer CoryMarlow, L[facility] [place].

## 2013-10-18 ENCOUNTER — Encounter (HOSPITAL_COMMUNITY): Payer: Self-pay | Admitting: Emergency Medicine

## 2013-10-18 ENCOUNTER — Emergency Department (HOSPITAL_COMMUNITY): Payer: Medicare Other

## 2013-10-18 ENCOUNTER — Inpatient Hospital Stay (HOSPITAL_COMMUNITY)
Admission: EM | Admit: 2013-10-18 | Discharge: 2013-10-23 | DRG: 689 | Disposition: A | Discharge status: Skilled Nursing Facility | Payer: Medicare Other | Attending: Internal Medicine | Admitting: Internal Medicine

## 2013-10-18 DIAGNOSIS — I10 Essential (primary) hypertension: Secondary | ICD-10-CM | POA: Diagnosis present

## 2013-10-18 DIAGNOSIS — Z951 Presence of aortocoronary bypass graft: Secondary | ICD-10-CM | POA: Diagnosis not present

## 2013-10-18 DIAGNOSIS — M25561 Pain in right knee: Secondary | ICD-10-CM

## 2013-10-18 DIAGNOSIS — G9340 Encephalopathy, unspecified: Secondary | ICD-10-CM | POA: Diagnosis present

## 2013-10-18 DIAGNOSIS — R5383 Other fatigue: Secondary | ICD-10-CM

## 2013-10-18 DIAGNOSIS — Y9210 Unspecified residential institution as the place of occurrence of the external cause: Secondary | ICD-10-CM | POA: Diagnosis present

## 2013-10-18 DIAGNOSIS — Z885 Allergy status to narcotic agent status: Secondary | ICD-10-CM | POA: Diagnosis not present

## 2013-10-18 DIAGNOSIS — Y92129 Unspecified place in nursing home as the place of occurrence of the external cause: Secondary | ICD-10-CM

## 2013-10-18 DIAGNOSIS — D649 Anemia, unspecified: Secondary | ICD-10-CM | POA: Diagnosis present

## 2013-10-18 DIAGNOSIS — R627 Adult failure to thrive: Secondary | ICD-10-CM | POA: Diagnosis present

## 2013-10-18 DIAGNOSIS — Z9109 Other allergy status, other than to drugs and biological substances: Secondary | ICD-10-CM | POA: Diagnosis not present

## 2013-10-18 DIAGNOSIS — M1710 Unilateral primary osteoarthritis, unspecified knee: Secondary | ICD-10-CM | POA: Diagnosis present

## 2013-10-18 DIAGNOSIS — Z66 Do not resuscitate: Secondary | ICD-10-CM | POA: Diagnosis present

## 2013-10-18 DIAGNOSIS — Z888 Allergy status to other drugs, medicaments and biological substances status: Secondary | ICD-10-CM | POA: Diagnosis not present

## 2013-10-18 DIAGNOSIS — E039 Hypothyroidism, unspecified: Secondary | ICD-10-CM | POA: Diagnosis present

## 2013-10-18 DIAGNOSIS — E860 Dehydration: Secondary | ICD-10-CM | POA: Diagnosis present

## 2013-10-18 DIAGNOSIS — F03918 Unspecified dementia, unspecified severity, with other behavioral disturbance: Secondary | ICD-10-CM | POA: Diagnosis present

## 2013-10-18 DIAGNOSIS — Z85820 Personal history of malignant melanoma of skin: Secondary | ICD-10-CM

## 2013-10-18 DIAGNOSIS — F0391 Unspecified dementia with behavioral disturbance: Secondary | ICD-10-CM | POA: Diagnosis present

## 2013-10-18 DIAGNOSIS — R5381 Other malaise: Secondary | ICD-10-CM | POA: Diagnosis present

## 2013-10-18 DIAGNOSIS — H9190 Unspecified hearing loss, unspecified ear: Secondary | ICD-10-CM | POA: Diagnosis present

## 2013-10-18 DIAGNOSIS — Z79899 Other long term (current) drug therapy: Secondary | ICD-10-CM

## 2013-10-18 DIAGNOSIS — B952 Enterococcus as the cause of diseases classified elsewhere: Secondary | ICD-10-CM | POA: Diagnosis present

## 2013-10-18 DIAGNOSIS — Z881 Allergy status to other antibiotic agents status: Secondary | ICD-10-CM

## 2013-10-18 DIAGNOSIS — I2510 Atherosclerotic heart disease of native coronary artery without angina pectoris: Secondary | ICD-10-CM | POA: Diagnosis present

## 2013-10-18 DIAGNOSIS — I498 Other specified cardiac arrhythmias: Secondary | ICD-10-CM | POA: Diagnosis present

## 2013-10-18 DIAGNOSIS — E018 Other iodine-deficiency related thyroid disorders and allied conditions: Secondary | ICD-10-CM | POA: Diagnosis present

## 2013-10-18 DIAGNOSIS — Z880 Allergy status to penicillin: Secondary | ICD-10-CM

## 2013-10-18 DIAGNOSIS — W06XXXA Fall from bed, initial encounter: Secondary | ICD-10-CM | POA: Diagnosis present

## 2013-10-18 DIAGNOSIS — N390 Urinary tract infection, site not specified: Principal | ICD-10-CM | POA: Diagnosis present

## 2013-10-18 DIAGNOSIS — Z9181 History of falling: Secondary | ICD-10-CM

## 2013-10-18 DIAGNOSIS — W19XXXA Unspecified fall, initial encounter: Secondary | ICD-10-CM

## 2013-10-18 DIAGNOSIS — E785 Hyperlipidemia, unspecified: Secondary | ICD-10-CM | POA: Diagnosis present

## 2013-10-18 DIAGNOSIS — R4182 Altered mental status, unspecified: Secondary | ICD-10-CM | POA: Diagnosis not present

## 2013-10-18 DIAGNOSIS — R531 Weakness: Secondary | ICD-10-CM | POA: Diagnosis present

## 2013-10-18 DIAGNOSIS — E871 Hypo-osmolality and hyponatremia: Secondary | ICD-10-CM | POA: Diagnosis present

## 2013-10-18 DIAGNOSIS — W19XXXD Unspecified fall, subsequent encounter: Secondary | ICD-10-CM

## 2013-10-18 DIAGNOSIS — IMO0002 Reserved for concepts with insufficient information to code with codable children: Secondary | ICD-10-CM

## 2013-10-18 DIAGNOSIS — D72829 Elevated white blood cell count, unspecified: Secondary | ICD-10-CM | POA: Diagnosis present

## 2013-10-18 DIAGNOSIS — Z9849 Cataract extraction status, unspecified eye: Secondary | ICD-10-CM

## 2013-10-18 DIAGNOSIS — Z7982 Long term (current) use of aspirin: Secondary | ICD-10-CM | POA: Diagnosis not present

## 2013-10-18 HISTORY — DX: Unspecified dementia, unspecified severity, with other behavioral disturbance: F03.918

## 2013-10-18 HISTORY — DX: Unspecified dementia with behavioral disturbance: F03.91

## 2013-10-18 HISTORY — DX: Essential (primary) hypertension: I10

## 2013-10-18 LAB — COMPREHENSIVE METABOLIC PANEL
ALT: 24 U/L (ref 0–35)
ANION GAP: 18 — AB (ref 5–15)
AST: 64 U/L — ABNORMAL HIGH (ref 0–37)
Albumin: 4.2 g/dL (ref 3.5–5.2)
Alkaline Phosphatase: 123 U/L — ABNORMAL HIGH (ref 39–117)
BUN: 20 mg/dL (ref 6–23)
CALCIUM: 9.7 mg/dL (ref 8.4–10.5)
CO2: 20 meq/L (ref 19–32)
CREATININE: 0.56 mg/dL (ref 0.50–1.10)
Chloride: 86 mEq/L — ABNORMAL LOW (ref 96–112)
GFR, EST NON AFRICAN AMERICAN: 78 mL/min — AB (ref >90)
GLUCOSE: 118 mg/dL — AB (ref 70–99)
Potassium: 4.1 mEq/L (ref 3.7–5.3)
Sodium: 124 mEq/L — ABNORMAL LOW (ref 137–147)
Total Bilirubin: 0.5 mg/dL (ref 0.3–1.2)
Total Protein: 7.2 g/dL (ref 6.0–8.3)

## 2013-10-18 LAB — URINALYSIS, ROUTINE W REFLEX MICROSCOPIC
Bilirubin Urine: NEGATIVE
Glucose, UA: NEGATIVE mg/dL
Ketones, ur: NEGATIVE mg/dL
NITRITE: NEGATIVE
PROTEIN: NEGATIVE mg/dL
Specific Gravity, Urine: 1.017 (ref 1.005–1.030)
UROBILINOGEN UA: 0.2 mg/dL (ref 0.0–1.0)
pH: 6.5 (ref 5.0–8.0)

## 2013-10-18 LAB — CORTISOL: Cortisol, Plasma: 26.8 ug/dL

## 2013-10-18 LAB — OSMOLALITY, URINE: Osmolality, Ur: 525 mOsm/kg (ref 390–1090)

## 2013-10-18 LAB — MAGNESIUM: MAGNESIUM: 2 mg/dL (ref 1.5–2.5)

## 2013-10-18 LAB — URINE MICROSCOPIC-ADD ON

## 2013-10-18 LAB — CBC
HCT: 35.9 % — ABNORMAL LOW (ref 36.0–46.0)
HEMOGLOBIN: 12.5 g/dL (ref 12.0–15.0)
MCH: 30 pg (ref 26.0–34.0)
MCHC: 34.8 g/dL (ref 30.0–36.0)
MCV: 86.1 fL (ref 78.0–100.0)
PLATELETS: 315 10*3/uL (ref 150–400)
RBC: 4.17 MIL/uL (ref 3.87–5.11)
RDW: 13.3 % (ref 11.5–15.5)
WBC: 15.7 10*3/uL — ABNORMAL HIGH (ref 4.0–10.5)

## 2013-10-18 LAB — OSMOLALITY: OSMOLALITY: 262 mosm/kg — AB (ref 275–300)

## 2013-10-18 LAB — SODIUM, URINE, RANDOM: SODIUM UR: 81 meq/L

## 2013-10-18 LAB — CREATININE, URINE, RANDOM: Creatinine, Urine: 36.92 mg/dL

## 2013-10-18 LAB — I-STAT TROPONIN, ED: TROPONIN I, POC: 0.01 ng/mL (ref 0.00–0.08)

## 2013-10-18 LAB — GLUCOSE, CAPILLARY: Glucose-Capillary: 111 mg/dL — ABNORMAL HIGH (ref 70–99)

## 2013-10-18 LAB — TSH: TSH: 6.39 u[IU]/mL — AB (ref 0.350–4.500)

## 2013-10-18 MED ORDER — DEXTROSE 5 % IV SOLN
1.0000 g | INTRAVENOUS | Status: DC
  Administered 2013-10-19 – 2013-10-22 (×4): 1 g via INTRAVENOUS
  Filled 2013-10-18 (×4): qty 10

## 2013-10-18 MED ORDER — SORBITOL 70 % SOLN
30.0000 mL | Freq: Every day | Status: DC | PRN
  Filled 2013-10-18: qty 30

## 2013-10-18 MED ORDER — LEVOTHYROXINE SODIUM 100 MCG IV SOLR
25.0000 ug | Freq: Every day | INTRAVENOUS | Status: DC
  Administered 2013-10-18 – 2013-10-19 (×2): 25 ug via INTRAVENOUS
  Filled 2013-10-18 (×3): qty 5

## 2013-10-18 MED ORDER — LORAZEPAM 2 MG/ML IJ SOLN
1.0000 mg | Freq: Once | INTRAMUSCULAR | Status: AC
  Administered 2013-10-18: 1 mg via INTRAVENOUS
  Filled 2013-10-18: qty 1

## 2013-10-18 MED ORDER — ZINC OXIDE 40 % EX OINT
1.0000 "application " | TOPICAL_OINTMENT | Freq: Three times a day (TID) | CUTANEOUS | Status: DC
  Administered 2013-10-18 – 2013-10-22 (×15): 1 via TOPICAL
  Filled 2013-10-18: qty 114

## 2013-10-18 MED ORDER — DICLOFENAC SODIUM 1 % TD GEL
2.0000 g | Freq: Three times a day (TID) | TRANSDERMAL | Status: DC
  Administered 2013-10-18 – 2013-10-22 (×15): 2 g via TOPICAL
  Filled 2013-10-18: qty 100

## 2013-10-18 MED ORDER — SODIUM CHLORIDE 0.9 % IV SOLN
Freq: Once | INTRAVENOUS | Status: AC
  Administered 2013-10-18: 06:00:00 via INTRAVENOUS

## 2013-10-18 MED ORDER — SODIUM CHLORIDE 0.9 % IV SOLN
INTRAVENOUS | Status: DC
  Administered 2013-10-18 – 2013-10-20 (×2): via INTRAVENOUS
  Filled 2013-10-18 (×6): qty 1000

## 2013-10-18 MED ORDER — CHLORHEXIDINE GLUCONATE 0.12 % MT SOLN
15.0000 mL | Freq: Two times a day (BID) | OROMUCOSAL | Status: DC
  Administered 2013-10-19 – 2013-10-23 (×9): 15 mL via OROMUCOSAL
  Filled 2013-10-18 (×12): qty 15

## 2013-10-18 MED ORDER — ALBUTEROL SULFATE (2.5 MG/3ML) 0.083% IN NEBU: 2.5000 mg | INHALATION_SOLUTION | RESPIRATORY_TRACT | Status: DC | PRN

## 2013-10-18 MED ORDER — ACETAMINOPHEN 650 MG RE SUPP: 650.0000 mg | Freq: Four times a day (QID) | RECTAL | Status: DC | PRN

## 2013-10-18 MED ORDER — LORATADINE 10 MG PO TABS
10.0000 mg | ORAL_TABLET | Freq: Every evening | ORAL | Status: DC
  Administered 2013-10-19 – 2013-10-22 (×4): 10 mg via ORAL
  Filled 2013-10-18 (×7): qty 1

## 2013-10-18 MED ORDER — ENOXAPARIN SODIUM 40 MG/0.4ML ~~LOC~~ SOLN
40.0000 mg | SUBCUTANEOUS | Status: DC
  Administered 2013-10-18 – 2013-10-23 (×6): 40 mg via SUBCUTANEOUS
  Filled 2013-10-18 (×6): qty 0.4

## 2013-10-18 MED ORDER — SODIUM CHLORIDE 0.9 % IV BOLUS (SEPSIS)
500.0000 mL | Freq: Once | INTRAVENOUS | Status: AC
  Administered 2013-10-18: 500 mL via INTRAVENOUS

## 2013-10-18 MED ORDER — ASPIRIN 81 MG PO CHEW
81.0000 mg | CHEWABLE_TABLET | Freq: Every day | ORAL | Status: DC
  Administered 2013-10-19 – 2013-10-23 (×5): 81 mg via ORAL
  Filled 2013-10-18 (×5): qty 1

## 2013-10-18 MED ORDER — FLEET ENEMA 7-19 GM/118ML RE ENEM: 1.0000 | ENEMA | Freq: Once | RECTAL | Status: AC | PRN

## 2013-10-18 MED ORDER — POLYETHYLENE GLYCOL 3350 17 G PO PACK
17.0000 g | PACK | Freq: Every day | ORAL | Status: DC
  Administered 2013-10-19 – 2013-10-22 (×4): 17 g via ORAL
  Filled 2013-10-18 (×6): qty 1

## 2013-10-18 MED ORDER — SENNOSIDES-DOCUSATE SODIUM 8.6-50 MG PO TABS: 1.0000 | ORAL_TABLET | Freq: Every evening | ORAL | Status: DC | PRN

## 2013-10-18 MED ORDER — LORAZEPAM 2 MG/ML IJ SOLN
0.5000 mg | Freq: Four times a day (QID) | INTRAMUSCULAR | Status: DC | PRN
  Administered 2013-10-18: 0.5 mg via INTRAVENOUS
  Filled 2013-10-18: qty 1

## 2013-10-18 MED ORDER — LIDOCAINE-EPINEPHRINE-TETRACAINE (LET) SOLUTION
3.0000 mL | Freq: Once | NASAL | Status: AC
  Administered 2013-10-18: 3 mL via TOPICAL
  Filled 2013-10-18: qty 3

## 2013-10-18 MED ORDER — DEXTROSE 5 % IV SOLN
1.0000 g | Freq: Once | INTRAVENOUS | Status: AC
  Administered 2013-10-18: 1 g via INTRAVENOUS
  Filled 2013-10-18: qty 10

## 2013-10-18 MED ORDER — ONDANSETRON HCL 4 MG/2ML IJ SOLN: 4.0000 mg | Freq: Four times a day (QID) | INTRAMUSCULAR | Status: DC | PRN

## 2013-10-18 MED ORDER — METOPROLOL SUCCINATE ER 100 MG PO TB24
100.0000 mg | ORAL_TABLET | Freq: Every day | ORAL | Status: DC
  Filled 2013-10-18: qty 1

## 2013-10-18 MED ORDER — FLUTICASONE PROPIONATE 50 MCG/ACT NA SUSP
1.0000 | Freq: Every day | NASAL | Status: DC
  Administered 2013-10-18 – 2013-10-23 (×6): 1 via NASAL
  Filled 2013-10-18: qty 16

## 2013-10-18 MED ORDER — METOPROLOL TARTRATE 1 MG/ML IV SOLN
5.0000 mg | Freq: Three times a day (TID) | INTRAVENOUS | Status: DC
  Filled 2013-10-18 (×4): qty 5

## 2013-10-18 MED ORDER — HYDROXYZINE HCL 10 MG PO TABS
10.0000 mg | ORAL_TABLET | Freq: Four times a day (QID) | ORAL | Status: DC | PRN
  Filled 2013-10-18: qty 1

## 2013-10-18 MED ORDER — METOPROLOL TARTRATE 1 MG/ML IV SOLN
2.5000 mg | Freq: Three times a day (TID) | INTRAVENOUS | Status: DC
  Administered 2013-10-18 – 2013-10-21 (×11): 2.5 mg via INTRAVENOUS
  Filled 2013-10-18 (×13): qty 5

## 2013-10-18 MED ORDER — LORAZEPAM 0.5 MG PO TABS
0.5000 mg | ORAL_TABLET | Freq: Four times a day (QID) | ORAL | Status: DC | PRN
  Administered 2013-10-19 – 2013-10-23 (×3): 0.5 mg via ORAL
  Filled 2013-10-18 (×3): qty 1

## 2013-10-18 MED ORDER — ACETAMINOPHEN 325 MG PO TABS: 650.0000 mg | ORAL_TABLET | Freq: Four times a day (QID) | ORAL | Status: DC | PRN

## 2013-10-18 MED ORDER — ONDANSETRON HCL 4 MG PO TABS: 4.0000 mg | ORAL_TABLET | Freq: Four times a day (QID) | ORAL | Status: DC | PRN

## 2013-10-18 MED ORDER — ACETAMINOPHEN 325 MG PO TABS
650.0000 mg | ORAL_TABLET | Freq: Three times a day (TID) | ORAL | Status: DC
  Administered 2013-10-19 – 2013-10-23 (×13): 650 mg via ORAL
  Filled 2013-10-18 (×18): qty 2

## 2013-10-18 MED ORDER — PANTOPRAZOLE SODIUM 40 MG IV SOLR
40.0000 mg | Freq: Every day | INTRAVENOUS | Status: DC
  Administered 2013-10-18 – 2013-10-21 (×4): 40 mg via INTRAVENOUS
  Filled 2013-10-18 (×5): qty 40

## 2013-10-18 MED ORDER — BIOTENE DRY MOUTH MT LIQD
15.0000 mL | Freq: Two times a day (BID) | OROMUCOSAL | Status: DC
  Administered 2013-10-19 – 2013-10-22 (×8): 15 mL via OROMUCOSAL

## 2013-10-18 NOTE — ED Notes (Signed)
[facility] EMS[facility] They stated that the pt has been having increased confusion with multiple falls over the last three days. Pt had an unwitnessed fall tonight in the common area onto carpet. Pt has an abrasion and a skin tear. One was to the rt elbow and one to the left leg. Pt is not on blood thinners. Pt is highly confused and agitated.

## 2013-10-18 NOTE — Progress Notes (Signed)
Clinical Social Work Department BRIEF PSYCHOSOCIAL ASSESSMENT 10/18/2013  Patient:  Olivia Nixon, Olivia Nixon     Account Number:  [id number]     Admit date:  10/18/2013  Clinical Social Worker:  Earlie Server  Date/Time:  10/18/2013 02:50 PM  Referred by:  Physician  Date Referred:  10/18/2013 Referred for  SNF Placement   Other Referral:   Interview type:  Family Other interview type:    PSYCHOSOCIAL DATA Living Status:  FACILITY Admitted from facility:  [place] Level of care:  [place] Primary support name:  Vicente Serene Primary support relationship to patient:  CHILD, ADULT Degree of support available:   Strong    CURRENT CONCERNS Current Concerns  Post-Acute Placement   Other Concerns:    SOCIAL WORK ASSESSMENT / PLAN CSW received referral due to patient being admitted from a facility. CSW reviewed chart and met with patient and step-dtr at bedside. CSW introduced myself and explained role.    Patient sleeping and did not wake up when CSW arrived. CSW spoke with step-dtr who came to visit but reports that son Vicente Serene) makes all decisions. CSW confirmed that patient lives at [facility] and explained DC plans. CSW called and spoke with son via phone as well. Patient and family have CSW contact information.    CSW completed FL2 and placed in chart. CSW spoke with SNF who is agreeable to accept when medically stable. CSW will continue to follow.   Assessment/plan status:  Psychosocial Support/Ongoing Assessment of Needs Other assessment/ plan:   Information/referral to community resources:   Will return to SNF    PATIENT'S/FAMILY'S RESPONSE TO PLAN OF CARE: Patient unable to participate in assessment at this time. Patient's step-dtr engaged and thanked CSW for visit. Son appreciative of call and reports he hopes that patient improves quickly. Son is happy with care at [facility] and reports he feels patient feels comfortable at SNF. Son reports he has good  family support and is grateful for care patient is receiving. Son agreeable to call CSW if further questions arise.       [place], [place] [phone]

## 2013-10-18 NOTE — Progress Notes (Signed)
INITIAL NUTRITION ASSESSMENT  DOCUMENTATION CODES Per approved criteria  -Not Applicable   INTERVENTION: Diet advancement per MD Add bid prostat and supplement when diet advanced. RD to monitor  NUTRITION DIAGNOSIS: Inadequate oral intake related to inability to eat as evidenced by npo status.   Goal: Diet advancement with tolerance of po intake.  Monitor:  Diet advancement, intake, labs, weight trend.  Reason for Assessment: MST  80 y.o. female  Admitting Dx: Acute encephalopathy  ASSESSMENT: Patient admitted with acute encephalopathy, hyponatremia, UTI.  [facility]  Hx includes FTT.    Patient is currently NPO and DNR.  Per chart, son reported that patient was not eating well prior to admit.  Patient was receiving prostat bid.  Used Ensure prior to last admit. Patient with diminished fat/muscle mass thought secondary to advanced age.  Patient is at nutrition risk 2/2 progressive mental decline.  Height: Ht Readings from Last 1 Encounters:  06/07/13 5\' 3"  (1.6 m)    Weight: Wt Readings from Last 1 Encounters:  10/18/13 112 lb 10.5 oz (51.1 kg)    Ideal Body Weight: 115 lbs  % Ideal Body Weight: 103  Wt Readings from Last 10 Encounters:  10/18/13 112 lb 10.5 oz (51.1 kg)  06/06/13 115 lb (52.164 kg)  06/05/13 115 lb (52.164 kg)  12/22/10 120 lb (54.432 kg)  12/06/09 122 lb (55.339 kg)  11/07/08 123 lb (55.792 kg)    Usual Body Weight: 110-115 lb  % Usual Body Weight: 100  BMI:  Body mass index is 19.96 kg/(m^2).  Estimated Nutritional Needs: Kcal: 1200-1500 Protein: 55-65 Fluid: >1.5L daily  Skin: intact  Diet Order:  NPO  EDUCATION NEEDS: -No education needs identified at this time  No intake or output data in the 24 hours ending 10/18/13 1423  Labs:   Recent Labs Lab 10/18/13 0358  NA 124*  K 4.1  CL 86*  CO2 20  BUN 20  CREATININE 0.56  CALCIUM 9.7  MG 2.0  GLUCOSE 118*     CBG (last 3)   Recent Labs  10/18/13 0342  GLUCAP 111*    Scheduled Meds: . acetaminophen  650 mg Oral TID  . aspirin  81 mg Oral Daily  . [START ON 10/19/2013] cefTRIAXone (ROCEPHIN)  IV  1 g Intravenous Q24H  . diclofenac sodium  2 g Topical TID  . enoxaparin (LOVENOX) injection  40 mg Subcutaneous Q24H  . fluticasone  1 spray Each Nare Daily  . levothyroxine  25 mcg Intravenous Q breakfast  . liver oil-zinc oxide  1 application Topical TID  . loratadine  10 mg Oral QPM  . metoprolol  2.5 mg Intravenous 3 times per day  . pantoprazole (PROTONIX) IV  40 mg Intravenous Daily  . polyethylene glycol  17 g Oral Daily    Continuous Infusions: . sodium chloride 0.9 % 1,000 mL infusion      Past Medical History  Diagnosis Date  . ASCVD (arteriosclerotic cardiovascular disease)     CABG surgery in 5/00  . Hypertension     Unspecified  . Hyperlipidemia   . Anemia, mild     Hemoglobin of 11.5-12  . Hearing loss   . 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 Diverticular disease   . DJD (degenerative joint disease)     Left knee  . Dementia with behavioral disturbance 10/18/2013  . HTN (hypertension) 10/18/2013  Past Surgical History  Procedure Laterality Date  . Coronary artery bypass graft  08/1998  . Tonsillectomy and adenoidectomy    . Total abdominal hysterectomy    . Melanoma excision      from right arm  . Cataract extraction      with lens implantation    Oran Rein, RD, LDN Clinical Inpatient Dietitian Pager:  [phone] Weekend and after hours pager:  [phone]

## 2013-10-18 NOTE — ED Provider Notes (Signed)
CSN: [id number]     Arrival date & time 10/18/13  [id number] History   First MD Initiated Contact with Patient 10/18/13 0346     Chief Complaint  Patient presents with  . Fall  . Altered Mental Status     (Consider location/radiation/quality/duration/timing/severity/associated sxs/prior Treatment) Patient is a 80 y.o. female presenting with fall. The history is provided by the EMS personnel. The history is limited by the condition of the patient (dementia). No language interpreter was used.  Fall This is a recurrent problem. Episode onset: unable. The problem occurs constantly. The problem has not changed since onset.Pertinent negatives include no chest pain, no abdominal pain, no headaches and no shortness of breath. Nothing aggravates the symptoms. Nothing relieves the symptoms. She has tried nothing for the symptoms. The treatment provided no relief.    Past Medical History  Diagnosis Date  . ASCVD (arteriosclerotic cardiovascular disease)     CABG surgery in 5/00  . Hypertension     Unspecified  . Hyperlipidemia   . Anemia, mild     Hemoglobin of 11.5-12  . Hearing loss   . 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 Diverticular disease   . DJD (degenerative joint disease)     Left knee   Past Surgical History  Procedure Laterality Date  . Coronary artery bypass graft  08/1998  . Tonsillectomy and adenoidectomy    . Total abdominal hysterectomy    . Melanoma excision      from right arm  . Cataract extraction      with lens implantation   No family history on file. History  Substance Use Topics  . Smoking status: Never Smoker   . Smokeless tobacco: Not on file  . Alcohol Use: No   OB History   Grav Para Term Preterm Abortions TAB SAB Ect Mult Living                 Review of Systems  Unable to perform ROS Respiratory: Negative for shortness of breath.   Cardiovascular: Negative for chest pain.    Gastrointestinal: Negative for abdominal pain.  Skin: Positive for wound.  Neurological: Negative for headaches.      Allergies  Amoxicillin; Bisacodyl; Celebrex; Chlorthalidone; Ciprofloxacin; Codeine; Darvocet; Diclofenac sodium; Flagyl; Ibuprofen; Iodine; Meloxicam; Methocarbamol; Oruvail; Ropinirole hcl; Sulfa antibiotics; Valium; and Zolpidem tartrate  Home Medications   Prior to Admission medications   Medication Sig Start Date End Date Taking? Authorizing Provider  acetaminophen (TYLENOL) 325 MG tablet Take 650 mg by mouth 3 (three) times daily.   Yes Historical Provider, MD  Amino Acids-Protein Hydrolys (FEEDING SUPPLEMENT, PRO-STAT SUGAR FREE 64,) LIQD Take 30 mLs by mouth 2 (two) times daily.   Yes Historical Provider, MD  aspirin 81 MG chewable tablet Chew 81 mg by mouth daily.   Yes Historical Provider, MD  diclofenac sodium (VOLTAREN) 1 % GEL Apply 2 g topically 3 (three) times daily. Applied to right knee   Yes Historical Provider, MD  DIMETHICONE, TOPICAL, (MOISTURE BARRIER) 5 % CREA Apply 1 application topically 3 (three) times daily.   Yes Historical Provider, MD  fluticasone (FLONASE) 50 MCG/ACT nasal spray Place 1 spray into both nostrils daily.   Yes Historical Provider, MD  hydrOXYzine (ATARAX/VISTARIL) 10 MG tablet Take 10 mg by mouth every 6 (six) hours as needed (for itching).   Yes Historical Provider, MD  levocetirizine (XYZAL) 5 MG tablet Take 5 mg by  mouth every evening.   Yes Historical Provider, MD  levothyroxine (SYNTHROID, LEVOTHROID) 50 MCG tablet Take 50 mcg by mouth daily.   Yes Historical Provider, MD  LORazepam (ATIVAN) 0.5 MG tablet Take 0.5 mg by mouth every 6 (six) hours as needed (agitation).   Yes Historical Provider, MD  losartan (COZAAR) 100 MG tablet Take 100 mg by mouth daily.   Yes Historical Provider, MD  metoprolol succinate (TOPROL-XL) 100 MG 24 hr tablet Take 100 mg by mouth daily. Take with or immediately following a meal.   Yes  Historical Provider, MD  omeprazole (PRILOSEC) 20 MG capsule Take 20 mg by mouth daily as needed (for reflux).   Yes Historical Provider, MD  polyethylene glycol (MIRALAX / GLYCOLAX) packet Take 17 g by mouth daily.   Yes Historical Provider, MD   BP 160/80  Temp(Src) 96.4 F (35.8 C) (Rectal)  Resp 25  SpO2 99% Physical Exam  Constitutional: She appears well-developed and well-nourished.  HENT:  Head: Normocephalic and atraumatic. Head is without raccoon's eyes.  Right Ear: No mastoid tenderness. No hemotympanum.  Left Ear: No mastoid tenderness. No hemotympanum.  Mouth/Throat: No oropharyngeal exudate.  Eyes: Conjunctivae are normal. Pupils are equal, round, and reactive to light.  Neck: Normal range of motion. Neck supple.  Cardiovascular: Normal rate, regular rhythm and intact distal pulses.   Pulmonary/Chest: Effort normal and breath sounds normal. No stridor. She has no wheezes. She has no rales.  Abdominal: Soft. Bowel sounds are normal. There is no tenderness. There is no rebound and no guarding.  Musculoskeletal: She exhibits no edema.  Neurological: She is alert. She has normal reflexes.  Skin: Skin is warm and dry. She is not diaphoretic.       ED Course  Procedures (including critical care time) Labs Review Labs Reviewed  CBC - Abnormal; Notable for the following:    WBC 15.7 (*)    HCT 35.9 (*)    All other components within normal limits  COMPREHENSIVE METABOLIC PANEL - Abnormal; Notable for the following:    Sodium 124 (*)    Chloride 86 (*)    Glucose, Bld 118 (*)    AST 64 (*)    Alkaline Phosphatase 123 (*)    GFR calc non Af Amer 78 (*)    Anion gap 18 (*)    All other components within normal limits  URINALYSIS, ROUTINE W REFLEX MICROSCOPIC - Abnormal; Notable for the following:    APPearance CLOUDY (*)    Hgb urine dipstick SMALL (*)    Leukocytes, UA MODERATE (*)    All other components within normal limits  URINE MICROSCOPIC-ADD ON - Abnormal;  Notable for the following:    Bacteria, UA MANY (*)    All other components within normal limits  CBG MONITORING, ED  I-STAT TROPOININ, ED    Imaging Review No results found.   EKG Interpretation   Date/Time:  Wednesday October 18 2013 03:26:40 EDT Ventricular Rate:  101 PR Interval:  164 QRS Duration: 96 QT Interval:  350 QTC Calculation: 454 R Axis:   -62 Text Interpretation:  Sinus tachycardia Left ventricular hypertrophy  Anterior Q waves, possibly due to LVH Confirmed by [facility]  MD,  Barbara Ahart ([id number]) on 10/18/2013 5:23:20 AM      MDM   Final diagnoses:  None   LACERATION REPAIR Performed by: Jasmine Awe Authorized by: Jasmine Awe Consent: Verbal consent obtained. Risks and benefits: risks, benefits and alternatives were discussed Consent given by: patient Patient  identity confirmed: provided demographic data Prepped and Draped in normal sterile fashion Wound explored  Laceration Location: medial distal tib fib left  Laceration Length:4cm  No Foreign Bodies seen or palpated  Anesthesia: topical LET  Irrigation method: syringe Amount of cleaning: standard  Skin closure:staples  Number of sutures: 9  Technique: staples  Patient tolerance: Patient tolerated the procedure well with no immediate complications.   MDM Reviewed: previous chart, nursing note and vitals Reviewed previous: labs and ECG Interpretation: labs, ECG, x-ray and CT scan (hyponatremia with AMS requiring correction at 452 cc/hr of NSS.  Elevated white count) Total time providing critical care: 30-74 minutes (60 minutes). This excludes time spent performing separately reportable procedures and services. Consults: admitting MD   Medications  LORazepam (ATIVAN) injection 1 mg (1 mg Intravenous Given 10/18/13 0455)  lidocaine-EPINEPHrine-tetracaine (LET) solution (3 mLs Topical Given 10/18/13 0457)  0.9 %  sodium chloride infusion ( Intravenous New Bag/Given  10/18/13 0606)  cefTRIAXone (ROCEPHIN) 1 g in dextrose 5 % 50 mL IVPB (1 g Intravenous New Bag/Given 10/18/13 0606)  LORazepam (ATIVAN) injection 1 mg (1 mg Intravenous Given 10/18/13 0604)   CRITICAL CARE Performed by: Jasmine Awe Total critical care time: 60 minutes Critical care time was exclusive of separately billable procedures and treating other patients. Critical care was necessary to treat or prevent imminent or life-threatening deterioration. Critical care was time spent personally by me on the following activities: development of treatment plan with patient and/or surrogate as well as nursing, discussions with consultants, evaluation of patient's response to treatment, examination of patient, obtaining history from patient or surrogate, ordering and performing treatments and interventions, ordering and review of laboratory studies, ordering and review of radiographic studies, pulse oximetry and re-evaluation of patient's condition.   Jasmine Awe, MD 10/18/13 [phone]

## 2013-10-18 NOTE — H&P (Signed)
[facility] History and Physical  Olivia Nixon:[id number] DOB: 06-07-1917 DOA: 10/18/2013  Referring physician: Dr Lars Masson PCP: Mathews Argyle, MD At [facility]  Chief Complaint: Fall/AMS  HPI: Olivia Nixon is a 80 y.o. female  Nursing home resident at [facility], [place] and [place] with history of coronary artery disease status post CABG in May of 2000, hypertension, hyperlipidemia, hypothyroidism, diverticular disease, degenerative joint disease, dementia who presents to the ED with recurrent falls and sudden onset agitation at the facility. Patient was transferred to the ED. Patient is moaning and groaning and somewhat lethargic on the hospital bed with a history of dementia and not answering questions appropriately and as such history was obtained from patient's son. Patient also given 3 milligram of IV Ativan in the emergency room. Per patient's son he was called around 2:30 AM on the morning of admission that patient had fallen again and was highly agitated patient's son doesn't think patient has had any fevers, no chills, no diarrhea, no constipation, no chest pain, no shortness of breath, no melena, no hematemesis, no hematochezia, no diarrhea, no constipation, no abdominal pain. Patient's son does endorse some weakness and pain in the right knee such that patient cannot bear weight on the right knee secondary to arthritis. Patient's son also doesn't think patient has been eating well. Patient's son states that patient has been getting some injections in the right knee for arthritis and was being set up to be seen by orthopod for a knee injection. Patient was seen in the emergency room urinalysis which was done was consistent with a UTI. Vital signs prior to transfer showed a temperature of 95 6 pulse of 72 respiration of 20 and a blood pressure 98/60 with sats of 97% on room air. Lab work obtained in the emergency room had a sodium of 124 chloride of 86  alk phosphatase of 123 AST of 64 otherwise complete metabolic profile is unremarkable. CBC done had a white count of 15.7 hemoglobin of 12.5 otherwise was within normal limits. Chest x-ray done was negative for any acute abnormalities. X-ray of the pelvis and the left tib-fib were negative for any acute fractures. EKG done showed a sinus tachycardia with LVH. We were called to admit the patient for further evaluation and management.   Review of Systems: As per history of present illness otherwise negative. Constitutional:  No weight loss, night sweats, Fevers, chills, fatigue.  HEENT:  No headaches, Difficulty swallowing,Tooth/dental problems,Sore throat,  No sneezing, itching, ear ache, nasal congestion, post nasal drip,  Cardio-vascular:  No chest pain, Orthopnea, PND, swelling in lower extremities, anasarca, dizziness, palpitations  GI:  No heartburn, indigestion, abdominal pain, nausea, vomiting, diarrhea, change in bowel habits, loss of appetite  Resp:  No shortness of breath with exertion or at rest. No excess mucus, no productive cough, No non-productive cough, No coughing up of blood.No change in color of mucus.No wheezing.No chest wall deformity  Skin:  no rash or lesions.  GU:  no dysuria, change in color of urine, no urgency or frequency. No flank pain.  Musculoskeletal:  No joint pain or swelling. No decreased range of motion. No back pain.  Psych:  No change in mood or affect. No depression or anxiety. No memory loss.   Past Medical History  Diagnosis Date  . ASCVD (arteriosclerotic cardiovascular disease)     CABG surgery in 5/00  . Hypertension     Unspecified  . Hyperlipidemia   . Anemia, mild  Hemoglobin of 11.5-12  . Hearing loss   . 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 Diverticular disease   . DJD (degenerative joint disease)     Left knee  . Dementia with behavioral disturbance 10/18/2013  .  HTN (hypertension) 10/18/2013   Past Surgical History  Procedure Laterality Date  . Coronary artery bypass graft  08/1998  . Tonsillectomy and adenoidectomy    . Total abdominal hysterectomy    . Melanoma excision      from right arm  . Cataract extraction      with lens implantation   Social History:  reports that she has never smoked. She does not have any smokeless tobacco history on file. She reports that she does not drink alcohol. Her drug history is not on file.  Allergies  Allergen Reactions  . Amoxicillin     PER MAR  . Bisacodyl     PER MAR  . Celebrex [Celecoxib]     PER MAR  . Chlorthalidone     PER MAR  . Ciprofloxacin     PER MAR  . Codeine     PER MAR  . Darvocet [Propoxyphene N-Acetaminophen]     PER MAR  . Diclofenac Sodium     PER MAR  . Flagyl [Metronidazole Hcl]     PER MAR  . Ibuprofen     PER MAR  . Iodine     PER MAR  . Meloxicam     PER MAR  . Methocarbamol     PER MAR  . Oruvail [Ketoprofen]     PER MAR  . Ropinirole Hcl     PER MAR  . Sulfa Antibiotics     PER MAR  . Valium     PER MAR  . Zolpidem Tartrate     PER MAR    History reviewed. No pertinent family history.   Prior to Admission medications   Medication Sig Start Date End Date Taking? Authorizing Provider  acetaminophen (TYLENOL) 325 MG tablet Take 650 mg by mouth 3 (three) times daily.   Yes Historical Provider, MD  Amino Acids-Protein Hydrolys (FEEDING SUPPLEMENT, PRO-STAT SUGAR FREE 64,) LIQD Take 30 mLs by mouth 2 (two) times daily.   Yes Historical Provider, MD  aspirin 81 MG chewable tablet Chew 81 mg by mouth daily.   Yes Historical Provider, MD  diclofenac sodium (VOLTAREN) 1 % GEL Apply 2 g topically 3 (three) times daily. Applied to right knee   Yes Historical Provider, MD  DIMETHICONE, TOPICAL, (MOISTURE BARRIER) 5 % CREA Apply 1 application topically 3 (three) times daily.   Yes Historical Provider, MD  fluticasone (FLONASE) 50 MCG/ACT nasal spray Place 1  spray into both nostrils daily.   Yes Historical Provider, MD  hydrOXYzine (ATARAX/VISTARIL) 10 MG tablet Take 10 mg by mouth every 6 (six) hours as needed (for itching).   Yes Historical Provider, MD  levocetirizine (XYZAL) 5 MG tablet Take 5 mg by mouth every evening.   Yes Historical Provider, MD  levothyroxine (SYNTHROID, LEVOTHROID) 50 MCG tablet Take 50 mcg by mouth daily.   Yes Historical Provider, MD  LORazepam (ATIVAN) 0.5 MG tablet Take 0.5 mg by mouth every 6 (six) hours as needed (agitation).   Yes Historical Provider, MD  losartan (COZAAR) 100 MG tablet Take 100 mg by mouth daily.   Yes Historical Provider, MD  metoprolol succinate (TOPROL-XL) 100 MG 24 hr tablet Take 100  mg by mouth daily. Take with or immediately following a meal.   Yes Historical Provider, MD  omeprazole (PRILOSEC) 20 MG capsule Take 20 mg by mouth daily as needed (for reflux).   Yes Historical Provider, MD  polyethylene glycol (MIRALAX / GLYCOLAX) packet Take 17 g by mouth daily.   Yes Historical Provider, MD   Physical Exam: Filed Vitals:   10/18/13 0834  BP: 140/70  Pulse: 84  Temp:   Resp:     BP 140/70  Pulse 84  Temp(Src) 97.9 F (36.6 C) (Axillary)  Resp 18  Wt 51.1 kg (112 lb 10.5 oz)  SpO2 99%  General:  Elderly frail female lethargic laying on hospital bed in no acute cardiopulmonary distress.  Eyes: PERRLA,EOMI, normal lids, irises & conjunctiva ENT: grossly normal hearing, lips & tongue. Extremely dry mucous membranes. Neck: no LAD, masses or thyromegaly Cardiovascular: RRR, no m/r/g. No LE edema. Respiratory: CTA bilaterally in the anterior lung fields, no w/r/r. Normal respiratory effort. Abdomen: soft, ntnd, positive bowel sounds, no rebound, no guarding Skin: no rash or induration seen on limited exam Musculoskeletal: Patient moving extremities spontaneously. Right lower extremity is flexed. Psychiatric: grossly normal mood and affect, speech fluent and appropriate Neurologic:  Patient is lethargic not following commands moving extremities spontaneously. Due to patient's mental status unable to do a full neurological exam.           Labs on Admission:  Basic Metabolic Panel:  Recent Labs Lab 10/18/13 0358  NA 124*  K 4.1  CL 86*  CO2 20  GLUCOSE 118*  BUN 20  CREATININE 0.56  CALCIUM 9.7   Liver Function Tests:  Recent Labs Lab 10/18/13 0358  AST 64*  ALT 24  ALKPHOS 123*  BILITOT 0.5  PROT 7.2  ALBUMIN 4.2   No results found for this basename: LIPASE, AMYLASE,  in the last 168 hours No results found for this basename: AMMONIA,  in the last 168 hours CBC:  Recent Labs Lab 10/18/13 0358  WBC 15.7*  HGB 12.5  HCT 35.9*  MCV 86.1  PLT 315   Cardiac Enzymes: No results found for this basename: CKTOTAL, CKMB, CKMBINDEX, TROPONINI,  in the last 168 hours  BNP (last 3 results) No results found for this basename: PROBNP,  in the last 8760 hours CBG:  Recent Labs Lab 10/18/13 0342  GLUCAP 111*    Radiological Exams on Admission: Dg Chest 2 View  10/18/2013   CLINICAL DATA:  FALL ALTERED MENTAL STATUS  EXAM: CHEST  2 VIEW  COMPARISON:  Prior radiograph from 06/06/2013  FINDINGS: Median sternotomy wires of underlying CABG markers and surgical clips are unchanged. Marked a tortuosity of the intrathoracic aorta is stable. Right were bowing of the trachea is unchanged. Cardiomegaly is stable.  Chronic emphysematous changes noted. No airspace consolidation, pleural effusion, or pulmonary edema is identified. There is no pneumothorax.  Diffuse osteopenia noted.  No acute osseous abnormality identified.  IMPRESSION: 1. No acute cardiopulmonary abnormality identified. 2. Stable cardiomegaly with sequelae of prior CABG. 3. Emphysematous changes.   Electronically Signed   By: Jeannine Boga M.D.   On: 10/18/2013 05:57   Dg Pelvis 1-2 Views  10/18/2013   CLINICAL DATA:  Fall.  Altered level of consciousness  EXAM: PELVIS - 1-2 VIEW   COMPARISON:  Abdominal CT 04/20/2003  FINDINGS: There is no evidence of pelvic fracture or diastasis. No other pelvic bone lesions are seen. There is generalized osteopenia. Diffuse atherosclerosis.  IMPRESSION: No acute  osseous abnormality.   Electronically Signed   By: Jorje Guild M.D.   On: 10/18/2013 05:56   Dg Tibia/fibula Left  10/18/2013   CLINICAL DATA:  FALL ALTERED MENTAL STATUS  EXAM: LEFT TIBIA AND FIBULA - 2 VIEW  COMPARISON:  None available.  FINDINGS: No acute fracture or dislocation identified. A cemented left total knee arthroplasty is in place. No periprosthetic lucency to suggest loosening or failure. Multiple surgical clips present within the left calf/leg. Vascular calcifications noted.  There is diffuse osteopenia.  IMPRESSION: 1. No acute fracture or dislocation. 2. Cemented right knee arthroplasty in place without evidence of hardware complication. 3. Diffuse osteopenia.   Electronically Signed   By: Jeannine Boga M.D.   On: 10/18/2013 05:54   Ct Head Wo Contrast  10/18/2013   CLINICAL DATA:  Fall with altered mental status  EXAM: CT HEAD WITHOUT CONTRAST  CT CERVICAL SPINE WITHOUT CONTRAST  TECHNIQUE: Multidetector CT imaging of the head and cervical spine was performed following the standard protocol without intravenous contrast. Multiplanar CT image reconstructions of the cervical spine were also generated.  COMPARISON:  06/05/2013  FINDINGS: CT HEAD FINDINGS  Skull and Sinuses:Negative for fracture or destructive process. Lobulated structure in the inferior left maxillary antrum likely represents a mucous retention cyst. Incidental torus palatini.  Orbits: Bilateral cataract resection.  Brain: No evidence of acute abnormality, such as acute infarction, hemorrhage, hydrocephalus, or mass lesion/mass effect. There is generalized brain atrophy. Stable pattern of moderate chronic small-vessel disease with ischemic gliosis confluent around the lateral ventricles and patchy  throughout the deep cerebral white matter.  CT CERVICAL SPINE FINDINGS  No evidence of acute fracture. No traumatic subluxation. There is chronic C4-5 anterolisthesis which is associated with advanced facet osteoarthritis. No gross cervical canal hematoma or prevertebral edema. Degenerative changes are diffuse. Disc disease is most advanced at C5-6, C6-7, and T2-3 with marked disc narrowing and osteophytosis. Facet degeneration is most advanced in the upper cervical spine. There is osseous foraminal narrowing bilaterally at C3-4, C4-5, and C5-6.  There is a 3 to 4 cm mass within the left lobe of the thyroid gland which is likely incidental based on sonography and scintigraphy in 2007. The nodule persistence was again communicated on cervical spine CT March 2015.  IMPRESSION: 1. No evidence of acute intracranial or cervical spine injury. 2. Chronic/senescent changes are described above.   Electronically Signed   By: Jorje Guild M.D.   On: 10/18/2013 05:54   Ct Cervical Spine Wo Contrast  10/18/2013   CLINICAL DATA:  Fall with altered mental status  EXAM: CT HEAD WITHOUT CONTRAST  CT CERVICAL SPINE WITHOUT CONTRAST  TECHNIQUE: Multidetector CT imaging of the head and cervical spine was performed following the standard protocol without intravenous contrast. Multiplanar CT image reconstructions of the cervical spine were also generated.  COMPARISON:  06/05/2013  FINDINGS: CT HEAD FINDINGS  Skull and Sinuses:Negative for fracture or destructive process. Lobulated structure in the inferior left maxillary antrum likely represents a mucous retention cyst. Incidental torus palatini.  Orbits: Bilateral cataract resection.  Brain: No evidence of acute abnormality, such as acute infarction, hemorrhage, hydrocephalus, or mass lesion/mass effect. There is generalized brain atrophy. Stable pattern of moderate chronic small-vessel disease with ischemic gliosis confluent around the lateral ventricles and patchy throughout the  deep cerebral white matter.  CT CERVICAL SPINE FINDINGS  No evidence of acute fracture. No traumatic subluxation. There is chronic C4-5 anterolisthesis which is associated with advanced facet osteoarthritis. No gross  cervical canal hematoma or prevertebral edema. Degenerative changes are diffuse. Disc disease is most advanced at C5-6, C6-7, and T2-3 with marked disc narrowing and osteophytosis. Facet degeneration is most advanced in the upper cervical spine. There is osseous foraminal narrowing bilaterally at C3-4, C4-5, and C5-6.  There is a 3 to 4 cm mass within the left lobe of the thyroid gland which is likely incidental based on sonography and scintigraphy in 2007. The nodule persistence was again communicated on cervical spine CT March 2015.  IMPRESSION: 1. No evidence of acute intracranial or cervical spine injury. 2. Chronic/senescent changes are described above.   Electronically Signed   By: Jorje Guild M.D.   On: 10/18/2013 05:54    EKG: Independently reviewed. Sinus tachycardia with LVH  Assessment/Plan Principal Problem:   Acute encephalopathy Active Problems:   Hyponatremia   UTI (lower urinary tract infection)   HYPOTHYROIDISM, POST-RADIOACTIVE IODINE   ANEMIA   ATHEROSCLEROTIC CARDIOVASCULAR DISEASE   Generalized weakness   Declining functional status   Fall   Dehydration   Dementia with behavioral disturbance   Fall at nursing home   FTT (failure to thrive) in adult   Right knee pain   Leukocytosis, unspecified   Encephalopathy acute   HTN (hypertension)  #1 acute encephalopathy Likely metabolic in nature secondary to urinary tract infection and hyponatremia in the setting of dementia. Patient received 3 mg of IV Ativan in the emergency room and is somewhat lethargic however protecting the airway. CT head which was done was negative. Will check urine cultures. Chest x-ray was negative. Will hydrate with IV fluids. Supportive care. Follow. If no significant improvement  in the next 24-48 hours may consider MRI of the head.  #2 hyponatremia Likely secondary to hypovolemic hyponatremia. Patient is clinically dehydrated. Prior to transfer from the [place] home patient's blood pressure was 98/60. Will check a urine sodium. Check a urine creatinine. Check osmolality. Check a urine osmolality. Check a TSH. Check a cortisol level. Place on IV fluids. Follow.  #3 urinary tract infection Will check urine cultures. Place on IV Rocephin.  #4 recurrent falls May be secondary to debility versus right knee pain which per son has led to patient not being able to bear weight. PT/OT. May need to ask patient's orthopod to assess patient for possible knee injection. Place on fall precautions. Follow.  #5 leukocytosis Likely secondary to problem #3. Check urine cultures. Chest x-ray was negative. Place on empiric IV Rocephin.  #6 failure to thrive  #7 dementia Patient likely with progressive worsening dementia. Will place on as needed Ativan for agitation.  #8 dehydration IV fluids  #9 hypertension Will place on IV Lopressor for now. Once patient is more alert may resume home regimen.  #10 hypothyroidism Check a TSH. We'll place on IV Synthroid until patient is more alert.  #11 coronary artery disease status post CABG Stable. We'll place on IV beta blocker for now. Once patient is more alert we'll resume home regimen.  #12 prophylaxis PPI for GI prophylaxis. Lovenox for DVT prophylaxis.   Code Status: DO NOT RESUSCITATE Family Communication: Updated son at bedside. Disposition Plan: Admit to [facility].  Time spent: 33 minutes  THOMPSON,DANIEL M.D. [facility] Pager [phone]  **Disclaimer: This note may have been dictated with voice recognition software. Similar sounding words can inadvertently be transcribed and this note may contain transcription errors which may not have been corrected upon publication of note.**

## 2013-10-18 NOTE — ED Notes (Signed)
Bed: WA04 Expected date:  Expected time:  Means of arrival:  Comments: EMS/80 yo with decline over the last few days-hx UTI/had an un-witnessed fall this evening

## 2013-10-19 ENCOUNTER — Inpatient Hospital Stay (HOSPITAL_COMMUNITY): Payer: Medicare Other

## 2013-10-19 DIAGNOSIS — M25569 Pain in unspecified knee: Secondary | ICD-10-CM

## 2013-10-19 LAB — CBC
HCT: 31.6 % — ABNORMAL LOW (ref 36.0–46.0)
HEMOGLOBIN: 10.8 g/dL — AB (ref 12.0–15.0)
MCH: 29.6 pg (ref 26.0–34.0)
MCHC: 34.2 g/dL (ref 30.0–36.0)
MCV: 86.6 fL (ref 78.0–100.0)
Platelets: 291 10*3/uL (ref 150–400)
RBC: 3.65 MIL/uL — ABNORMAL LOW (ref 3.87–5.11)
RDW: 13.6 % (ref 11.5–15.5)
WBC: 10.6 10*3/uL — ABNORMAL HIGH (ref 4.0–10.5)

## 2013-10-19 LAB — BASIC METABOLIC PANEL
Anion gap: 13 (ref 5–15)
Anion gap: 13 (ref 5–15)
BUN: 9 mg/dL (ref 6–23)
BUN: 9 mg/dL (ref 6–23)
CALCIUM: 8.8 mg/dL (ref 8.4–10.5)
CO2: 18 mEq/L — ABNORMAL LOW (ref 19–32)
CO2: 20 meq/L (ref 19–32)
CREATININE: 0.52 mg/dL (ref 0.50–1.10)
CREATININE: 0.55 mg/dL (ref 0.50–1.10)
Calcium: 8.6 mg/dL (ref 8.4–10.5)
Chloride: 96 mEq/L (ref 96–112)
Chloride: 96 mEq/L (ref 96–112)
GFR calc Af Amer: >90 mL/min (ref >90)
GFR calc Af Amer: >90 mL/min (ref >90)
GFR calc non Af Amer: 78 mL/min — ABNORMAL LOW (ref >90)
GFR, EST NON AFRICAN AMERICAN: 80 mL/min — AB (ref >90)
GLUCOSE: 104 mg/dL — AB (ref 70–99)
Glucose, Bld: 97 mg/dL (ref 70–99)
POTASSIUM: 3.8 meq/L (ref 3.7–5.3)
Potassium: 4 mEq/L (ref 3.7–5.3)
Sodium: 127 mEq/L — ABNORMAL LOW (ref 137–147)
Sodium: 129 mEq/L — ABNORMAL LOW (ref 137–147)

## 2013-10-19 MED ORDER — ENSURE COMPLETE PO LIQD
237.0000 mL | Freq: Three times a day (TID) | ORAL | Status: DC
  Administered 2013-10-19 – 2013-10-22 (×10): 237 mL via ORAL

## 2013-10-19 MED ORDER — BOOST / RESOURCE BREEZE PO LIQD
1.0000 | Freq: Two times a day (BID) | ORAL | Status: DC
  Administered 2013-10-19: 1 via ORAL

## 2013-10-19 MED ORDER — LEVOTHYROXINE SODIUM 100 MCG IV SOLR
37.5000 ug | Freq: Every day | INTRAVENOUS | Status: DC
  Administered 2013-10-20 – 2013-10-21 (×2): 37.5 ug via INTRAVENOUS
  Filled 2013-10-19 (×3): qty 5

## 2013-10-19 NOTE — Progress Notes (Signed)
CARE MANAGEMENT NOTE 10/19/2013  Patient:  Coralie CarpenDOYLE,Maysun Y   Account Number:  [id number]  Date Initiated:  10/19/2013  Documentation initiated by:  [facility]Kjell Brannen  Subjective/Objective Assessment:   80 year old female admitted with UTI and falls.     [facility]   [facility]   Anticipated DC Date:  10/22/2013   Anticipated DC Plan:  SKILLED NURSING FACILITY  In-house referral  Clinical Social Worker      DC Planning Services  CM consult      Choice offered to / List presented to:             Status of service:   Medicare Important Message given?   (If response is "NO", the following Medicare IM given date fields will be blank) Date Medicare IM given:   Medicare IM given by:   Date Additional Medicare IM given:   Additional Medicare IM given by:    Discharge Disposition:    Per UR Regulation:  Reviewed for med. necessity/level of care/duration of stay  If discussed at Long Length of Stay Meetings, dates discussed:    Comments:

## 2013-10-19 NOTE — Progress Notes (Signed)
NUTRITION FOLLOW UP  Intervention:   - Ensure Complete TID - RD to continue to monitor   Nutrition Dx:   Inadequate oral intake related to inability to eat as evidenced by npo status - ongoing but now related to clear liquid diet as evidenced by diet order.    Goal:   Diet advancement with tolerance of po intake - met, diet advanced today to dysphagia 3/thin    Monitor:   Weights, labs, intake   Assessment:   Patient admitted with acute encephalopathy, hyponatremia, UTI. Resident of [place], [place] and [employer]. Hx includes FTT.   7/15: Patient is currently NPO and DNR. Per chart, son reported that patient was not eating well prior to admit. Patient was receiving prostat bid. Used Ensure prior to last admit.  Patient with diminished fat/muscle mass thought secondary to advanced age. Patient is at nutrition risk 2/2 progressive mental decline.  7/16: Attempted to visit pt, however multiple people in room. Per RN, pt has been tolerating clear liquid diet, ate most of trays today. No nausea/vomiting reported.    Height: Ht Readings from Last 1 Encounters:  06/07/13 $RemoveB'5\' 3"'lYZoSeTG$  (1.6 m)    Weight Status:   Wt Readings from Last 1 Encounters:  10/18/13 112 lb 10.5 oz (51.1 kg)    Re-estimated needs:  Kcal: 1200-1500  Protein: 55-65  Fluid: >1.5L daily   Skin: Intact   Diet Order: Clear Liquid   Intake/Output Summary (Last 24 hours) at 10/19/13 1119 Last data filed at 10/19/13 0600  Gross per 24 hour  Intake    800 ml  Output      0 ml  Net    800 ml    Last BM: 7/15   Labs:   Recent Labs Lab 10/18/13 0358 10/19/13 0542  NA 124* 127*  K 4.1 3.8  CL 86* 96  CO2 20 18*  BUN 20 9  CREATININE 0.56 0.52  CALCIUM 9.7 8.6  MG 2.0  --   GLUCOSE 118* 97    CBG (last 3)   Recent Labs  10/18/13 0342  GLUCAP 111*    Scheduled Meds: . acetaminophen  650 mg Oral TID  . antiseptic oral rinse  15 mL Mouth Rinse q12n4p  . aspirin  81 mg  Oral Daily  . cefTRIAXone (ROCEPHIN)  IV  1 g Intravenous Q24H  . chlorhexidine  15 mL Mouth Rinse BID  . diclofenac sodium  2 g Topical TID  . enoxaparin (LOVENOX) injection  40 mg Subcutaneous Q24H  . feeding supplement (RESOURCE BREEZE)  1 Container Oral BID BM  . fluticasone  1 spray Each Nare Daily  . [START ON 10/20/2013] levothyroxine  37.5 mcg Intravenous Q breakfast  . liver oil-zinc oxide  1 application Topical TID  . loratadine  10 mg Oral QPM  . metoprolol  2.5 mg Intravenous 3 times per day  . pantoprazole (PROTONIX) IV  40 mg Intravenous Daily  . polyethylene glycol  17 g Oral Daily    Continuous Infusions: . sodium chloride 0.9 % 1,000 mL infusion 75 mL/hr at 10/18/13 [address] MS, RD, LDN [phone] Pager [phone] Weekend/After Hours Pager

## 2013-10-19 NOTE — Evaluation (Signed)
Physical Therapy Evaluation Patient Details Name: Olivia Nixon MRN: [id number] DOB: 02-Aug-1917 Today's Date: 10/19/2013   History of Present Illness  80 yo female admitted with acute encephalopathy, falls. Hx of dementia, CABG, HTN. Pt is from SNF  Clinical Impression  On eval, pt required Max assist + 2 for mobility-only able to stand, with flexed posturing, for ~10-15 seconds. Pt unable to extend R knee during session-maintained at ~70-90 degrees of flexion. Recommend return to SNF. Pt is very HOH and confused.     Follow Up Recommendations SNF    Equipment Recommendations  None recommended by PT    Recommendations for Other Services       Precautions / Restrictions Precautions Precautions: Fall Restrictions Weight Bearing Restrictions: No      Mobility  Bed Mobility Overal bed mobility: Needs Assistance Bed Mobility: Supine to Sit;Sit to Supine;Rolling Rolling: Mod assist   Supine to sit: Mod assist Sit to supine: Mod assist   General bed mobility comments: assist for trunk and bil LEs. Increased time. Utilized bedpad for scooting, positioning.   Transfers Overall transfer level: Needs assistance Equipment used: Rolling walker (2 wheeled) Transfers: Sit to/from Stand Sit to Stand: From elevated surface;Max assist;+2 physical assistance;+2 safety/equipment         General transfer comment: x 2. Assist to rise, stabilize, control descent. Pt tends to push into extension. Difficulty keeping both feet planted on floor during transitions. Only able to stand for ~10-15 seconds before needing to sit down.   Ambulation/Gait                Stairs            Wheelchair Mobility    Modified Rankin (Stroke Patients Only)       Balance Overall balance assessment: Needs assistance;History of Falls Sitting-balance support: Bilateral upper extremity supported;Feet unsupported;Feet supported Sitting balance-[name]: Poor     Standing balance  support: Bilateral upper extremity supported;During functional activity Standing balance-[name]: Zero                               Pertinent Vitals/Pain R knee-unrated.    Home Living Family/patient expects to be discharged to:: Skilled nursing facility                      Prior Function Level of Independence: Needs assistance               Hand Dominance        Extremity/Trunk Assessment   Upper Extremity Assessment: Generalized weakness           Lower Extremity Assessment: RLE deficits/detail RLE Deficits / Details: pt kept r knee flexed at ~70-90 degrees. Resistant to therapist attempts to extend    Cervical / Trunk Assessment: Kyphotic  Communication   Communication: HOH  Cognition Arousal/Alertness: Awake/alert Behavior During Therapy: WFL for tasks assessed/performed Overall Cognitive Status: History of cognitive impairments - at baseline                      General Comments      Exercises        Assessment/Plan    PT Assessment Patient needs continued PT services  PT Diagnosis Difficulty walking;Abnormality of gait;Generalized weakness;Altered mental status   PT Problem List Decreased strength;Decreased range of motion;Decreased activity tolerance;Decreased balance;Decreased mobility;Pain;Decreased cognition;Decreased knowledge of use of DME  PT Treatment Interventions Functional mobility  training;Therapeutic activities;Therapeutic exercise;Patient/family education;Balance training;DME instruction   PT Goals (Current goals can be found in the Care Plan section) Acute Rehab PT Goals Patient Stated Goal: none stated PT Goal Formulation: Patient unable to participate in goal setting Time For Goal Achievement: 11/02/13 Potential to Achieve Goals: Fair    Frequency Min 2X/week   Barriers to discharge        Co-evaluation               End of Session   Activity Tolerance: Patient limited by  fatigue;Patient limited by pain Patient left: in bed;with call bell/phone within reach;with bed alarm set           Time: [id number] PT Time Calculation (min): 29 min   Charges:   PT Evaluation $Initial PT Evaluation Tier I: 1 Procedure PT Treatments $Therapeutic Activity: 23-37 mins   PT G Codes:          Rebeca AlertJannie Mirabel Ahlgren, MPT Pager: [phone]

## 2013-10-19 NOTE — Progress Notes (Signed)
OT Cancellation Note  Patient Details Name: Olivia Nixon MRN: [id number] DOB: 12/13/1917   Cancelled Treatment:    Reason Eval/Treat Not Completed: OT screened, no needs identified, will sign off. Note pt from SNF and plan to return to SNF. Will defer OT eval.   Lennox LaityStone, Saya Mccoll Stafford [phone] 10/19/2013, 10:53 AM

## 2013-10-19 NOTE — Progress Notes (Signed)
[facility] PROGRESS NOTE  Olivia Nixon ZOX:[id number] DOB: 12/27/1917 DOA: 10/18/2013 PCP: Ginette OttoSTONEKING,HAL THOMAS, MD  Assessment/Plan: #1 acute encephalopathy Secondary to urinary tract infection and hyponatremia in the setting of underlying dementia. Clinical improvement. Patient more alert. Urine cultures are pending. Continue IV Rocephin. Follow.  #2 urinary tract infection Urine cultures are pending. Continue IV Rocephin.  #3 right knee pain Likely secondary to osteoarthritis. Will get x-rays of the right knee. Also with orthopedics for possible steroid injection and further evaluation.  #4 hyponatremia Likely secondary to hypovolemic hyponatremia. Sodium levels improving with hydration. Cortisol levels within normal limits. TSH slightly elevated. Continue IV fluids. Follow.  #5 recurrent falls PT/OT.  #6 leukocytosis Likely secondary to problem #2. Chest x-ray is negative. Urine cultures are pending. WBC trending down. Continue empiric IV Rocephin.  #7 failure to thrive  #8 dementia  #9 hypothyroidism TSH slightly elevated at 6.390. Increased Synthroid dose.  #10 hypertension Continue IV Lopressor.  #11 coronary artery disease status post CABG Stable. Continue beta blocker.  #12 prophylaxis PPI for GI prophylaxis. Lovenox for DVT prophylaxis.  Code Status: DO NOT RESUSCITATE Family Communication: Updated patient and sisters at bedside. Disposition Plan: SNF when medically stable   Consultants:  Orthopedics pending  Procedures:  CT head 10/18/2013  CT C-spine 10/18/2013  Chest x-ray 10/18/2013  X-ray of the pelvis 10/18/2013  X-ray of the left tib-fib 10/18/2013  Antibiotics:  IV Rocephin 10/18/2013  HPI/Subjective: Patient with no complaints. Patient pleasantly confused. Patient alert and interacting with her family.  Objective: Filed Vitals:   10/19/13 0620  BP: 154/68  Pulse: 84  Temp: 97.9 F (36.6 C)  Resp: 18     Intake/Output Summary (Last 24 hours) at 10/19/13 1116 Last data filed at 10/19/13 0600  Gross per 24 hour  Intake    800 ml  Output      0 ml  Net    800 ml   Filed Weights   10/18/13 0746  Weight: 51.1 kg (112 lb 10.5 oz)    Exam:   General:  nad  Cardiovascular: rrr  Respiratory: ctab  Abdomen: Soft, nontender, nondistended, positive bowel sounds  Musculoskeletal: No CCE  Data Reviewed: Basic Metabolic Panel:  Recent Labs Lab 10/18/13 0358 10/19/13 0542  NA 124* 127*  K 4.1 3.8  CL 86* 96  CO2 20 18*  GLUCOSE 118* 97  BUN 20 9  CREATININE 0.56 0.52  CALCIUM 9.7 8.6  MG 2.0  --    Liver Function Tests:  Recent Labs Lab 10/18/13 0358  AST 64*  ALT 24  ALKPHOS 123*  BILITOT 0.5  PROT 7.2  ALBUMIN 4.2   No results found for this basename: LIPASE, AMYLASE,  in the last 168 hours No results found for this basename: AMMONIA,  in the last 168 hours CBC:  Recent Labs Lab 10/18/13 0358 10/19/13 0542  WBC 15.7* 10.6*  HGB 12.5 10.8*  HCT 35.9* 31.6*  MCV 86.1 86.6  PLT 315 291   Cardiac Enzymes: No results found for this basename: CKTOTAL, CKMB, CKMBINDEX, TROPONINI,  in the last 168 hours BNP (last 3 results) No results found for this basename: PROBNP,  in the last 8760 hours CBG:  Recent Labs Lab 10/18/13 0342  GLUCAP 111*    No results found for this or any previous visit (from the past 240 hour(s)).   Studies: Dg Chest 2 View  10/18/2013   CLINICAL DATA:  FALL ALTERED MENTAL STATUS  EXAM: CHEST  2  VIEW  COMPARISON:  Prior radiograph from 06/06/2013  FINDINGS: Median sternotomy wires of underlying CABG markers and surgical clips are unchanged. Marked a tortuosity of the intrathoracic aorta is stable. Right were bowing of the trachea is unchanged. Cardiomegaly is stable.  Chronic emphysematous changes noted. No airspace consolidation, pleural effusion, or pulmonary edema is identified. There is no pneumothorax.  Diffuse osteopenia  noted.  No acute osseous abnormality identified.  IMPRESSION: 1. No acute cardiopulmonary abnormality identified. 2. Stable cardiomegaly with sequelae of prior CABG. 3. Emphysematous changes.   Electronically Signed   By: Rise Mu M.D.   On: 10/18/2013 05:57   Dg Pelvis 1-2 Views  10/18/2013   CLINICAL DATA:  Fall.  Altered level of consciousness  EXAM: PELVIS - 1-2 VIEW  COMPARISON:  Abdominal CT 04/20/2003  FINDINGS: There is no evidence of pelvic fracture or diastasis. No other pelvic bone lesions are seen. There is generalized osteopenia. Diffuse atherosclerosis.  IMPRESSION: No acute osseous abnormality.   Electronically Signed   By: Tiburcio Pea M.D.   On: 10/18/2013 05:56   Dg Tibia/fibula Left  10/18/2013   CLINICAL DATA:  FALL ALTERED MENTAL STATUS  EXAM: LEFT TIBIA AND FIBULA - 2 VIEW  COMPARISON:  None available.  FINDINGS: No acute fracture or dislocation identified. A cemented left total knee arthroplasty is in place. No periprosthetic lucency to suggest loosening or failure. Multiple surgical clips present within the left calf/leg. Vascular calcifications noted.  There is diffuse osteopenia.  IMPRESSION: 1. No acute fracture or dislocation. 2. Cemented right knee arthroplasty in place without evidence of hardware complication. 3. Diffuse osteopenia.   Electronically Signed   By: Rise Mu M.D.   On: 10/18/2013 05:54   Ct Head Wo Contrast  10/18/2013   CLINICAL DATA:  Fall with altered mental status  EXAM: CT HEAD WITHOUT CONTRAST  CT CERVICAL SPINE WITHOUT CONTRAST  TECHNIQUE: Multidetector CT imaging of the head and cervical spine was performed following the standard protocol without intravenous contrast. Multiplanar CT image reconstructions of the cervical spine were also generated.  COMPARISON:  06/05/2013  FINDINGS: CT HEAD FINDINGS  Skull and Sinuses:Negative for fracture or destructive process. Lobulated structure in the inferior left maxillary antrum likely  represents a mucous retention cyst. Incidental torus palatini.  Orbits: Bilateral cataract resection.  Brain: No evidence of acute abnormality, such as acute infarction, hemorrhage, hydrocephalus, or mass lesion/mass effect. There is generalized brain atrophy. Stable pattern of moderate chronic small-vessel disease with ischemic gliosis confluent around the lateral ventricles and patchy throughout the deep cerebral white matter.  CT CERVICAL SPINE FINDINGS  No evidence of acute fracture. No traumatic subluxation. There is chronic C4-5 anterolisthesis which is associated with advanced facet osteoarthritis. No gross cervical canal hematoma or prevertebral edema. Degenerative changes are diffuse. Disc disease is most advanced at C5-6, C6-7, and T2-3 with marked disc narrowing and osteophytosis. Facet degeneration is most advanced in the upper cervical spine. There is osseous foraminal narrowing bilaterally at C3-4, C4-5, and C5-6.  There is a 3 to 4 cm mass within the left lobe of the thyroid gland which is likely incidental based on sonography and scintigraphy in 2007. The nodule persistence was again communicated on cervical spine CT March 2015.  IMPRESSION: 1. No evidence of acute intracranial or cervical spine injury. 2. Chronic/senescent changes are described above.   Electronically Signed   By: Tiburcio Pea M.D.   On: 10/18/2013 05:54   Ct Cervical Spine Wo Contrast  10/18/2013  CLINICAL DATA:  Fall with altered mental status  EXAM: CT HEAD WITHOUT CONTRAST  CT CERVICAL SPINE WITHOUT CONTRAST  TECHNIQUE: Multidetector CT imaging of the head and cervical spine was performed following the standard protocol without intravenous contrast. Multiplanar CT image reconstructions of the cervical spine were also generated.  COMPARISON:  06/05/2013  FINDINGS: CT HEAD FINDINGS  Skull and Sinuses:Negative for fracture or destructive process. Lobulated structure in the inferior left maxillary antrum likely represents a  mucous retention cyst. Incidental torus palatini.  Orbits: Bilateral cataract resection.  Brain: No evidence of acute abnormality, such as acute infarction, hemorrhage, hydrocephalus, or mass lesion/mass effect. There is generalized brain atrophy. Stable pattern of moderate chronic small-vessel disease with ischemic gliosis confluent around the lateral ventricles and patchy throughout the deep cerebral white matter.  CT CERVICAL SPINE FINDINGS  No evidence of acute fracture. No traumatic subluxation. There is chronic C4-5 anterolisthesis which is associated with advanced facet osteoarthritis. No gross cervical canal hematoma or prevertebral edema. Degenerative changes are diffuse. Disc disease is most advanced at C5-6, C6-7, and T2-3 with marked disc narrowing and osteophytosis. Facet degeneration is most advanced in the upper cervical spine. There is osseous foraminal narrowing bilaterally at C3-4, C4-5, and C5-6.  There is a 3 to 4 cm mass within the left lobe of the thyroid gland which is likely incidental based on sonography and scintigraphy in 2007. The nodule persistence was again communicated on cervical spine CT March 2015.  IMPRESSION: 1. No evidence of acute intracranial or cervical spine injury. 2. Chronic/senescent changes are described above.   Electronically Signed   By: Tiburcio Pea M.D.   On: 10/18/2013 05:54    Scheduled Meds: . acetaminophen  650 mg Oral TID  . antiseptic oral rinse  15 mL Mouth Rinse q12n4p  . aspirin  81 mg Oral Daily  . cefTRIAXone (ROCEPHIN)  IV  1 g Intravenous Q24H  . chlorhexidine  15 mL Mouth Rinse BID  . diclofenac sodium  2 g Topical TID  . enoxaparin (LOVENOX) injection  40 mg Subcutaneous Q24H  . feeding supplement (RESOURCE BREEZE)  1 Container Oral BID BM  . fluticasone  1 spray Each Nare Daily  . levothyroxine  25 mcg Intravenous Q breakfast  . liver oil-zinc oxide  1 application Topical TID  . loratadine  10 mg Oral QPM  . metoprolol  2.5 mg  Intravenous 3 times per day  . pantoprazole (PROTONIX) IV  40 mg Intravenous Daily  . polyethylene glycol  17 g Oral Daily   Continuous Infusions: . sodium chloride 0.9 % 1,000 mL infusion 75 mL/hr at 10/18/13 2000    Principal Problem:   Acute encephalopathy Active Problems:   Hyponatremia   UTI (lower urinary tract infection)   HYPOTHYROIDISM, POST-RADIOACTIVE IODINE   ANEMIA   ATHEROSCLEROTIC CARDIOVASCULAR DISEASE   Generalized weakness   Declining functional status   Fall   Dehydration   Dementia with behavioral disturbance   Fall at nursing home   FTT (failure to thrive) in adult   Right knee pain   Leukocytosis, unspecified   Encephalopathy acute   HTN (hypertension)    Time spent: 49 MINS    [facility] MD [facility] Pager [phone]. If 7PM-7AM, please contact night-coverage at [email or web address], password [facility] 10/19/2013, 11:16 AM  LOS: 1 day

## 2013-10-20 ENCOUNTER — Inpatient Hospital Stay (HOSPITAL_COMMUNITY): Payer: Medicare Other

## 2013-10-20 LAB — CBC
HEMATOCRIT: 32.8 % — AB (ref 36.0–46.0)
Hemoglobin: 11.1 g/dL — ABNORMAL LOW (ref 12.0–15.0)
MCH: 29.5 pg (ref 26.0–34.0)
MCHC: 33.8 g/dL (ref 30.0–36.0)
MCV: 87.2 fL (ref 78.0–100.0)
Platelets: 325 10*3/uL (ref 150–400)
RBC: 3.76 MIL/uL — ABNORMAL LOW (ref 3.87–5.11)
RDW: 13.8 % (ref 11.5–15.5)
WBC: 9.9 10*3/uL (ref 4.0–10.5)

## 2013-10-20 LAB — BASIC METABOLIC PANEL
Anion gap: 15 (ref 5–15)
BUN: 10 mg/dL (ref 6–23)
CO2: 20 meq/L (ref 19–32)
CREATININE: 0.57 mg/dL (ref 0.50–1.10)
Calcium: 8.9 mg/dL (ref 8.4–10.5)
Chloride: 95 mEq/L — ABNORMAL LOW (ref 96–112)
GFR calc Af Amer: 90 mL/min — ABNORMAL LOW (ref >90)
GFR calc non Af Amer: 77 mL/min — ABNORMAL LOW (ref >90)
GLUCOSE: 104 mg/dL — AB (ref 70–99)
Potassium: 3.7 mEq/L (ref 3.7–5.3)
Sodium: 130 mEq/L — ABNORMAL LOW (ref 137–147)

## 2013-10-20 MED ORDER — LIDOCAINE HCL 1 % IJ SOLN
INTRAMUSCULAR | Status: AC
  Filled 2013-10-20: qty 20

## 2013-10-20 MED ORDER — METHYLPREDNISOLONE ACETATE 80 MG/ML IJ SUSP
80.0000 mg | Freq: Once | INTRAMUSCULAR | Status: DC
  Filled 2013-10-20 (×2): qty 1

## 2013-10-20 NOTE — Progress Notes (Signed)
[facility] PROGRESS NOTE  Olivia Nixon JYN:[id number] DOB: [birth year] DOA: 10/18/2013 PCP: Ginette Otto, MD  Assessment/Plan: #1 acute encephalopathy Secondary to urinary tract infection and hyponatremia in the setting of underlying dementia. Clinical improvement. Patient more alert. Urine cultures are pending. Continue IV Rocephin. Follow.  #2 urinary tract infection Urine cultures are pending. Continue IV Rocephin.  #3 right knee pain Likely secondary to osteoarthritis. X-ray of the right knee consistent with severe osteoarthritis. Patient has been seen by orthopedics and patient has received a cortisone injection. PT/OT.  #4 hyponatremia Likely secondary to hypovolemic hyponatremia. Sodium levels improving with hydration. Cortisol levels within normal limits. TSH slightly elevated. Continue IV fluids. Follow.  #5 recurrent falls PT/OT.  #6 leukocytosis Likely secondary to problem #2. Chest x-ray is negative. Urine cultures are pending. WBC trending down. Continue empiric IV Rocephin.  #7 failure to thrive  #8 dementia  #9 hypothyroidism TSH slightly elevated at 6.390. Increased Synthroid dose.  #10 hypertension Change IV Lopressor back to home dose beta blocker..  #11 coronary artery disease status post CABG Stable. Continue beta blocker.  #12 prophylaxis PPI for GI prophylaxis. Lovenox for DVT prophylaxis.  Code Status: DO NOT RESUSCITATE Family Communication: Updated patient and son at bedside. Disposition Plan: SNF when medically stable   Consultants:  Orthopedics: Dr. Charlann Boxer 10/20/2013  Procedures:  CT head 10/18/2013  CT C-spine 10/18/2013  Chest x-ray 10/18/2013  X-ray of the pelvis 10/18/2013  X-ray of the left tib-fib 10/18/2013  X-ray right knee 10/19/2013  Antibiotics:  IV Rocephin 10/18/2013  HPI/Subjective: Patient with no complaints. Patient alert.  Objective: Filed Vitals:   10/20/13 0543  BP: 140/78  Pulse: 101   Temp: 98.2 F (36.8 C)  Resp: 20    Intake/Output Summary (Last 24 hours) at 10/20/13 1220 Last data filed at 10/20/13 0500  Gross per 24 hour  Intake   1725 ml  Output      0 ml  Net   1725 ml   Filed Weights   10/18/13 0746 10/20/13 0543  Weight: 51.1 kg (112 lb 10.5 oz) 51.7 kg (113 lb 15.7 oz)    Exam:   General:  nad  Cardiovascular: rrr  Respiratory: ctab  Abdomen: Soft, nontender, nondistended, positive bowel sounds  Musculoskeletal: No CCE. RLE flexed  Data Reviewed: Basic Metabolic Panel:  Recent Labs Lab 10/18/13 0358 10/19/13 0542 10/19/13 1335 10/20/13 0605  NA 124* 127* 129* 130*  K 4.1 3.8 4.0 3.7  CL 86* 96 96 95*  CO2 20 18* 20 20  GLUCOSE 118* 97 104* 104*  BUN 20 9 9 10   CREATININE 0.56 0.52 0.55 0.57  CALCIUM 9.7 8.6 8.8 8.9  MG 2.0  --   --   --    Liver Function Tests:  Recent Labs Lab 10/18/13 0358  AST 64*  ALT 24  ALKPHOS 123*  BILITOT 0.5  PROT 7.2  ALBUMIN 4.2   No results found for this basename: LIPASE, AMYLASE,  in the last 168 hours No results found for this basename: AMMONIA,  in the last 168 hours CBC:  Recent Labs Lab 10/18/13 0358 10/19/13 0542 10/20/13 0605  WBC 15.7* 10.6* 9.9  HGB 12.5 10.8* 11.1*  HCT 35.9* 31.6* 32.8*  MCV 86.1 86.6 87.2  PLT 315 291 325   Cardiac Enzymes: No results found for this basename: CKTOTAL, CKMB, CKMBINDEX, TROPONINI,  in the last 168 hours BNP (last 3 results) No results found for this basename: PROBNP,  in the last  8760 hours CBG:  Recent Labs Lab 10/18/13 0342  GLUCAP 111*    Recent Results (from the past 240 hour(s))  URINE CULTURE     Status: None   Collection Time    10/18/13 11:57 AM      Result Value Ref Range Status   Specimen Description URINE, CATHETERIZED   Final   Special Requests NONE   Final   Culture  Setup Time     Final   Value: 10/18/2013 17:32     Performed at [facility] Count     Final   Value: >=100,000  COLONIES/ML     Performed at [facility]   Culture     Final   Value: ENTEROCOCCUS SPECIES     Performed at [facility]   Report Status PENDING   Incomplete     Studies: Dg Pelvis Portable  10/20/2013   CLINICAL DATA:  Right hip pain after a fall  EXAM: PORTABLE PELVIS 1-2 VIEWS  COMPARISON:  10/18/2013  FINDINGS: Degenerative change in curvature of the lumbar spine is again partly visualized. Atheromatous aortic calcification without calcified aneurysm. Normal bowel gas pattern. No displaced pelvic fracture. The bones are subjectively osteopenic.  IMPRESSION: No displaced pelvic fracture identified.   Electronically Signed   By: Christiana Pellant M.D.   On: 10/20/2013 10:16   Dg Knee Complete 4 Views Right  10/19/2013   CLINICAL DATA:  Chronic right knee pain  EXAM: RIGHT KNEE - COMPLETE 4+ VIEW  COMPARISON:  None.  FINDINGS: No acute fracture or dislocation. Large joint effusion. Severe osteoarthritis of the patellofemoral compartment and medial femorotibial compartment. Moderate osteoarthritis of the lateral femorotibial compartment. No lytic or sclerotic osseous lesion. Peripheral vascular atherosclerotic disease.  IMPRESSION: 1. Tricompartmental osteoarthritis of the right knee most severe in the patellofemoral compartment and medial femorotibial compartment.   Electronically Signed   By: Elige Ko   On: 10/19/2013 15:14    Scheduled Meds: . acetaminophen  650 mg Oral TID  . antiseptic oral rinse  15 mL Mouth Rinse q12n4p  . aspirin  81 mg Oral Daily  . cefTRIAXone (ROCEPHIN)  IV  1 g Intravenous Q24H  . chlorhexidine  15 mL Mouth Rinse BID  . diclofenac sodium  2 g Topical TID  . enoxaparin (LOVENOX) injection  40 mg Subcutaneous Q24H  . feeding supplement (ENSURE COMPLETE)  237 mL Oral TID BM  . fluticasone  1 spray Each Nare Daily  . levothyroxine  37.5 mcg Intravenous Q breakfast  . lidocaine      . liver oil-zinc oxide  1 application Topical TID  . loratadine   10 mg Oral QPM  . methylPREDNISolone acetate  80 mg Intra-articular Once  . metoprolol  2.5 mg Intravenous 3 times per day  . pantoprazole (PROTONIX) IV  40 mg Intravenous Daily  . polyethylene glycol  17 g Oral Daily   Continuous Infusions: . sodium chloride 0.9 % 1,000 mL infusion 75 mL/hr at 10/20/13 1051    Principal Problem:   Acute encephalopathy Active Problems:   Hyponatremia   UTI (lower urinary tract infection)   HYPOTHYROIDISM, POST-RADIOACTIVE IODINE   ANEMIA   ATHEROSCLEROTIC CARDIOVASCULAR DISEASE   Generalized weakness   Declining functional status   Fall   Dehydration   Dementia with behavioral disturbance   Fall at nursing home   FTT (failure to thrive) in adult   Right knee pain   Leukocytosis, unspecified   Encephalopathy acute  HTN (hypertension)    Time spent: 3135 MINS    [facility] LPHOMPSON,DANIEL MD [facility] Pager [phone]. If 7PM-7AM, please contact night-coverage at [email or web address], password [email or web address] 10/20/2013, 12:20 PM  LOS: 2 days

## 2013-10-20 NOTE — Progress Notes (Addendum)
   Subjective: Right knee OA / pain  Patient is very hard of hearing.  She is confused about the events leading up to her being brought to the hospital.  She says that she was told that she fell out of bed. States that she has a history of bilateral knee pain, she had a left TKA which helped with that knee, she regrets never having proceeded with the left TKA.  She does states that she has pain with movement of the right knee and right hip with motion of that leg.  Objective:   VITALS:   Filed Vitals:   10/20/13 0543  BP: 140/78  Pulse: 101  Temp: 98.2 F (36.8 C)  Resp: 20    Dorsiflexion/Plantar flexion intact No cellulitis present Compartment soft  LABS  Recent Labs  10/18/13 0358 10/19/13 0542 10/20/13 0605  HGB 12.5 10.8* 11.1*  HCT 35.9* 31.6* 32.8*  WBC 15.7* 10.6* 9.9  PLT 315 291 325     Recent Labs  10/19/13 0542 10/19/13 1335 10/20/13 0605  NA 127* 129* 130*  K 3.8 4.0 3.7  BUN 9 9 10   CREATININE 0.52 0.55 0.57  GLUCOSE 97 104* 104*     Assessment/Plan: Right knee OA / pain  X-rays of the right knee were obtained which reveal significant OA changes including bone-on-bone.  To try to help with the pain in the right knee we will proceed with a cortisone injection.  Discussed doing an injection in the knee to help relieve some of her OA pain. She states that she had previously received 3 injections in the past that have helped the right knee. Stated that she had the left TKA many years ago and now wishes that she would have proceeded with the right TKA.  After discussing the injection she wished to proceed with an injection today. The superolateral area of the right knee was cleaned with chlorhexidine. Cold spray was the applied to the area to help numb it prior to the injection.  The right knee was then injected with 80 mg of DepoMedrol and 4 cc's of 1% lidocaine.  The patient tolerated the procedure well.  The area was the cleaned and gauze and tape  were place over the injection site.  Order written to apply cold to the area to help with inflammation and swelling.   Right hip pain  Patient has pain into the right hip when her right knee was manipulated into position.   Do to the pain we will obtain a x-ray of the pelvis to rule out a fracture to this area.    Olivia AuerbachMatthew S. Azarion Nixon   PAC  10/20/2013, 7:55 AM

## 2013-10-20 NOTE — Progress Notes (Signed)
Clinical Social Work  Per MD, patient might be ready to DC over the weekend. CS[facility] spoke with [facility] who is agreeable to accept whenever medically stable. CSW will leave handoff for weekend CSW to contact supervisor at SNF at [phone]. Signed FL2 and DNR on chart.  PayneHolly Caylor [place], [place] [phone]

## 2013-10-21 DIAGNOSIS — I10 Essential (primary) hypertension: Secondary | ICD-10-CM

## 2013-10-21 LAB — BASIC METABOLIC PANEL
Anion gap: 12 (ref 5–15)
BUN: 12 mg/dL (ref 6–23)
CALCIUM: 8.4 mg/dL (ref 8.4–10.5)
CHLORIDE: 98 meq/L (ref 96–112)
CO2: 22 mEq/L (ref 19–32)
CREATININE: 0.64 mg/dL (ref 0.50–1.10)
GFR, EST AFRICAN AMERICAN: 86 mL/min — AB (ref >90)
GFR, EST NON AFRICAN AMERICAN: 75 mL/min — AB (ref >90)
Glucose, Bld: 158 mg/dL — ABNORMAL HIGH (ref 70–99)
Potassium: 4 mEq/L (ref 3.7–5.3)
Sodium: 132 mEq/L — ABNORMAL LOW (ref 137–147)

## 2013-10-21 MED ORDER — PANTOPRAZOLE SODIUM 40 MG PO TBEC
40.0000 mg | DELAYED_RELEASE_TABLET | Freq: Every day | ORAL | Status: DC
  Administered 2013-10-22 – 2013-10-23 (×2): 40 mg via ORAL
  Filled 2013-10-21 (×2): qty 1

## 2013-10-21 MED ORDER — METOPROLOL SUCCINATE ER 100 MG PO TB24
100.0000 mg | ORAL_TABLET | Freq: Every day | ORAL | Status: DC
  Administered 2013-10-21 – 2013-10-23 (×3): 100 mg via ORAL
  Filled 2013-10-21 (×3): qty 1

## 2013-10-21 MED ORDER — LEVOTHYROXINE SODIUM 75 MCG PO TABS
75.0000 ug | ORAL_TABLET | Freq: Every day | ORAL | Status: DC
  Administered 2013-10-22 – 2013-10-23 (×2): 75 ug via ORAL
  Filled 2013-10-21 (×3): qty 1

## 2013-10-21 NOTE — Progress Notes (Signed)
This patient is receiving Protonix and Synthroid. Based on criteria approved by the Pharmacy and Therapeutics Committee, this medication is being converted to the equivalent oral dose form. These criteria include:   . The patient is eating (either orally or per tube) and/or has been taking other orally administered medications for at least 24 hours.  . This patient has no evidence of active gastrointestinal bleeding or impaired GI absorption (gastrectomy, short bowel, patient on TNA or NPO).   If you have questions about this conversion, please contact the pharmacy department.  Berkley HarveyLegge, Kadence Mimbs [place], [facility] CenterRPH 10/21/2013 9:40 AM

## 2013-10-21 NOTE — Progress Notes (Signed)
[facility] PROGRESS NOTE  Olivia Nixon:[id number] DOB: [birth year] DOA: 10/18/2013 PCP: Ginette Otto, MD  Assessment/Plan: #1 acute encephalopathy Secondary to urinary tract infection and hyponatremia in the setting of underlying dementia. Clinical improvement. Patient more alert. Urine cultures are pending. Continue IV Rocephin. Follow.  #2 urinary tract infection Urine cultures are pending. Continue IV Rocephin.  #3 right knee pain Likely secondary to osteoarthritis. X-ray of the right knee consistent with severe osteoarthritis. Patient has been seen by orthopedics and patient has received a cortisone injection. PT/OT.  #4 hyponatremia Likely secondary to hypovolemic hyponatremia. Sodium levels improving with hydration. Cortisol levels within normal limits. TSH slightly elevated. NSL IV fluids. Follow.  #5 recurrent falls PT/OT.  #6 leukocytosis Likely secondary to problem #2. Chest x-ray is negative. Urine cultures are pending. WBC trending down. Continue empiric IV Rocephin.  #7 failure to thrive  #8 dementia  #9 hypothyroidism TSH slightly elevated at 6.390. Increased Synthroid dose.  #10 hypertension Continue home dose beta blocker..  #11 coronary artery disease status post CABG Stable. Continue beta blocker.  #12 prophylaxis PPI for GI prophylaxis. Lovenox for DVT prophylaxis.  Code Status: DO NOT RESUSCITATE Family Communication: Updated patient no family at bedside. Disposition Plan: SNF when medically stable   Consultants:  Orthopedics: Dr. Charlann Boxer 10/20/2013  Procedures:  CT head 10/18/2013  CT C-spine 10/18/2013  Chest x-ray 10/18/2013  X-ray of the pelvis 10/18/2013  X-ray of the left tib-fib 10/18/2013  X-ray right knee 10/19/2013  Antibiotics:  IV Rocephin 10/18/2013  HPI/Subjective: Patient with no complaints. Patient alert. Patient asking for food.  Objective: Filed Vitals:   10/21/13 1441  BP: 153/67  Pulse: 91   Temp: 98.7 F (37.1 C)  Resp: 18    Intake/Output Summary (Last 24 hours) at 10/21/13 1710 Last data filed at 10/21/13 1325  Gross per 24 hour  Intake   2535 ml  Output      0 ml  Net   2535 ml   Filed Weights   10/18/13 0746 10/20/13 0543  Weight: 51.1 kg (112 lb 10.5 oz) 51.7 kg (113 lb 15.7 oz)    Exam:   General:  nad  Cardiovascular: rrr  Respiratory: ctab  Abdomen: Soft, nontender, nondistended, positive bowel sounds  Musculoskeletal: No CCE. RLE less flexed  Data Reviewed: Basic Metabolic Panel:  Recent Labs Lab 10/18/13 0358 10/19/13 0542 10/19/13 1335 10/20/13 0605 10/21/13 0536  NA 124* 127* 129* 130* 132*  K 4.1 3.8 4.0 3.7 4.0  CL 86* 96 96 95* 98  CO2 20 18* 20 20 22   GLUCOSE 118* 97 104* 104* 158*  BUN 20 9 9 10 12   CREATININE 0.56 0.52 0.55 0.57 0.64  CALCIUM 9.7 8.6 8.8 8.9 8.4  MG 2.0  --   --   --   --    Liver Function Tests:  Recent Labs Lab 10/18/13 0358  AST 64*  ALT 24  ALKPHOS 123*  BILITOT 0.5  PROT 7.2  ALBUMIN 4.2   No results found for this basename: LIPASE, AMYLASE,  in the last 168 hours No results found for this basename: AMMONIA,  in the last 168 hours CBC:  Recent Labs Lab 10/18/13 0358 10/19/13 0542 10/20/13 0605  WBC 15.7* 10.6* 9.9  HGB 12.5 10.8* 11.1*  HCT 35.9* 31.6* 32.8*  MCV 86.1 86.6 87.2  PLT 315 291 325   Cardiac Enzymes: No results found for this basename: CKTOTAL, CKMB, CKMBINDEX, TROPONINI,  in the last 168 hours BNP (  last 3 results) No results found for this basename: PROBNP,  in the last 8760 hours CBG:  Recent Labs Lab 10/18/13 0342  GLUCAP 111*    Recent Results (from the past 240 hour(s))  URINE CULTURE     Status: None   Collection Time    10/18/13 11:57 AM      Result Value Ref Range Status   Specimen Description URINE, CATHETERIZED   Final   Special Requests NONE   Final   Culture  Setup Time     Final   Value: 10/18/2013 17:32     Performed at [facility]   [place] Count     Final   Value: >=100,000 COLONIES/ML     Performed at [facility]   Culture     Final   Value: ENTEROCOCCUS SPECIES     Performed at [facility]   Report Status PENDING   Incomplete     Studies: Dg Pelvis Portable  10/20/2013   CLINICAL DATA:  Right hip pain after a fall  EXAM: PORTABLE PELVIS 1-2 VIEWS  COMPARISON:  10/18/2013  FINDINGS: Degenerative change in curvature of the lumbar spine is again partly visualized. Atheromatous aortic calcification without calcified aneurysm. Normal bowel gas pattern. No displaced pelvic fracture. The bones are subjectively osteopenic.  IMPRESSION: No displaced pelvic fracture identified.   Electronically Signed   By: Christiana PellantGretchen  Green M.D.   On: 10/20/2013 10:16    Scheduled Meds: . acetaminophen  650 mg Oral TID  . antiseptic oral rinse  15 mL Mouth Rinse q12n4p  . aspirin  81 mg Oral Daily  . cefTRIAXone (ROCEPHIN)  IV  1 g Intravenous Q24H  . chlorhexidine  15 mL Mouth Rinse BID  . diclofenac sodium  2 g Topical TID  . enoxaparin (LOVENOX) injection  40 mg Subcutaneous Q24H  . feeding supplement (ENSURE COMPLETE)  237 mL Oral TID BM  . fluticasone  1 spray Each Nare Daily  . [START ON 10/22/2013] levothyroxine  75 mcg Oral QAC breakfast  . liver oil-zinc oxide  1 application Topical TID  . loratadine  10 mg Oral QPM  . methylPREDNISolone acetate  80 mg Intra-articular Once  . metoprolol  2.5 mg Intravenous 3 times per day  . [START ON 10/22/2013] pantoprazole  40 mg Oral Daily  . polyethylene glycol  17 g Oral Daily   Continuous Infusions: . sodium chloride 0.9 % 1,000 mL infusion 10 mL/hr at 10/21/13 [id number]    Principal Problem:   Acute encephalopathy Active Problems:   Hyponatremia   UTI (lower urinary tract infection)   HYPOTHYROIDISM, POST-RADIOACTIVE IODINE   ANEMIA   ATHEROSCLEROTIC CARDIOVASCULAR DISEASE   Generalized weakness   Declining functional status   Fall   Dehydration    Dementia with behavioral disturbance   Fall at nursing home   FTT (failure to thrive) in adult   Right knee pain   Leukocytosis, unspecified   Encephalopathy acute   HTN (hypertension)    Time spent: 9335 MINS    [facility] LPHOMPSON,Jasyn Mey MD [facility] Pager [phone]. If 7PM-7AM, please contact night-coverage at [email or web address], password [facility] 10/21/2013, 5:10 PM  LOS: 3 days

## 2013-10-22 ENCOUNTER — Inpatient Hospital Stay (HOSPITAL_COMMUNITY): Payer: Medicare Other

## 2013-10-22 LAB — CBC
HEMATOCRIT: 28.4 % — AB (ref 36.0–46.0)
Hemoglobin: 9.6 g/dL — ABNORMAL LOW (ref 12.0–15.0)
MCH: 29.7 pg (ref 26.0–34.0)
MCHC: 33.8 g/dL (ref 30.0–36.0)
MCV: 87.9 fL (ref 78.0–100.0)
PLATELETS: 317 10*3/uL (ref 150–400)
RBC: 3.23 MIL/uL — ABNORMAL LOW (ref 3.87–5.11)
RDW: 13.9 % (ref 11.5–15.5)
WBC: 12.8 10*3/uL — AB (ref 4.0–10.5)

## 2013-10-22 LAB — BASIC METABOLIC PANEL
ANION GAP: 11 (ref 5–15)
BUN: 18 mg/dL (ref 6–23)
CO2: 22 mEq/L (ref 19–32)
Calcium: 8.5 mg/dL (ref 8.4–10.5)
Chloride: 99 mEq/L (ref 96–112)
Creatinine, Ser: 0.63 mg/dL (ref 0.50–1.10)
GFR calc Af Amer: 87 mL/min — ABNORMAL LOW (ref >90)
GFR, EST NON AFRICAN AMERICAN: 75 mL/min — AB (ref >90)
GLUCOSE: 141 mg/dL — AB (ref 70–99)
Potassium: 4.7 mEq/L (ref 3.7–5.3)
SODIUM: 132 meq/L — AB (ref 137–147)

## 2013-10-22 LAB — URINE CULTURE: Colony Count: >=100000

## 2013-10-22 MED ORDER — PSEUDOEPHEDRINE HCL ER 120 MG PO TB12
120.0000 mg | ORAL_TABLET | Freq: Two times a day (BID) | ORAL | Status: DC
  Administered 2013-10-22 – 2013-10-23 (×3): 120 mg via ORAL
  Filled 2013-10-22 (×4): qty 1

## 2013-10-22 MED ORDER — IPRATROPIUM-ALBUTEROL 0.5-2.5 (3) MG/3ML IN SOLN
3.0000 mL | RESPIRATORY_TRACT | Status: DC | PRN
  Administered 2013-10-22: 3 mL via RESPIRATORY_TRACT
  Filled 2013-10-22: qty 3

## 2013-10-22 MED ORDER — IPRATROPIUM BROMIDE 0.02 % IN SOLN: 0.5000 mg | RESPIRATORY_TRACT | Status: DC | PRN

## 2013-10-22 MED ORDER — FUROSEMIDE 10 MG/ML IJ SOLN
20.0000 mg | Freq: Once | INTRAMUSCULAR | Status: AC
  Administered 2013-10-22: 20 mg via INTRAVENOUS
  Filled 2013-10-22: qty 2

## 2013-10-22 MED ORDER — NITROFURANTOIN MONOHYD MACRO 100 MG PO CAPS
100.0000 mg | ORAL_CAPSULE | Freq: Two times a day (BID) | ORAL | Status: DC
  Administered 2013-10-22 – 2013-10-23 (×3): 100 mg via ORAL
  Filled 2013-10-22 (×4): qty 1

## 2013-10-22 MED ORDER — OXYCODONE HCL 5 MG PO TABS
5.0000 mg | ORAL_TABLET | Freq: Once | ORAL | Status: AC
  Administered 2013-10-22: 5 mg via ORAL
  Filled 2013-10-22: qty 1

## 2013-10-22 NOTE — Progress Notes (Signed)
[facility] PROGRESS NOTE  Olivia Nixon WUJ:[id number] DOB: 11/21/1917 DOA: 10/18/2013 PCP: Ginette OttoSTONEKING,HAL THOMAS, MD  Assessment/Plan: #1 acute encephalopathy Secondary to urinary tract infection and hyponatremia in the setting of underlying dementia. Clinical improvement. Patient more alert. Urine cultures with enterococcus species. Change IV Rocephin to oral Macrobid. Follow.  #2 enterococcus urinary tract infection Urine cultures with enterococcus. Change IV Rocephin to oral Macrobid.   #3 right knee pain Likely secondary to osteoarthritis. X-ray of the right knee consistent with severe osteoarthritis. Patient has been seen by orthopedics and patient has received a cortisone injection. PT/OT.  #4 hyponatremia Likely secondary to hypovolemic hyponatremia. Sodium levels improved with hydration. Cortisol levels within normal limits. TSH slightly elevated. NSL IV fluids. Follow.  #5 recurrent falls PT/OT.  #6 leukocytosis Likely secondary to problem #2. Chest x-ray is negative. Urine cultures with enterococcus species. Change IV Rocephin to oral Macrobid. Follow.   #7 failure to thrive  #8 dementia  #9 hypothyroidism TSH slightly elevated at 6.390. Increased Synthroid dose.  #10 hypertension Continue home dose beta blocker..  #11 coronary artery disease status post CABG Stable. Continue beta blocker.  #12 prophylaxis PPI for GI prophylaxis. Lovenox for DVT prophylaxis.  Code Status: DO NOT RESUSCITATE Family Communication: Updated patient and son at bedside. Disposition Plan: SNF when medically stable hopefully tomorrow.   Consultants:  Orthopedics: Dr. Charlann Boxerlin 10/20/2013  Procedures:  CT head 10/18/2013  CT C-spine 10/18/2013  Chest x-ray 10/18/2013, 10/22/2013  X-ray of the pelvis 10/18/2013  X-ray of the left tib-fib 10/18/2013  X-ray right knee 10/19/2013  Antibiotics:  IV Rocephin 10/18/2013>>>> 10/22/2013  Oral Macrobid  10/22/2013  HPI/Subjective: Patient complaining of a "chest cold".  Patient alert. Patient asking for food.  Objective: Filed Vitals:   10/22/13 0519  BP: 146/69  Pulse: 63  Temp: 98.4 F (36.9 C)  Resp: 18    Intake/Output Summary (Last 24 hours) at 10/22/13 1224 Last data filed at 10/22/13 0900  Gross per 24 hour  Intake    780 ml  Output    300 ml  Net    480 ml   Filed Weights   10/18/13 0746 10/20/13 0543 10/22/13 0519  Weight: 51.1 kg (112 lb 10.5 oz) 51.7 kg (113 lb 15.7 oz) 52.3 kg (115 lb 4.8 oz)    Exam:   General:  nad  Cardiovascular: rrr  Respiratory: min-mild exp wheezing.  Abdomen: Soft, nontender, nondistended, positive bowel sounds  Musculoskeletal: No CCE. RLE less flexed  Data Reviewed: Basic Metabolic Panel:  Recent Labs Lab 10/18/13 0358 10/19/13 0542 10/19/13 1335 10/20/13 0605 10/21/13 0536 10/22/13 0601  NA 124* 127* 129* 130* 132* 132*  K 4.1 3.8 4.0 3.7 4.0 4.7  CL 86* 96 96 95* 98 99  CO2 20 18* 20 20 22 22   GLUCOSE 118* 97 104* 104* 158* 141*  BUN 20 9 9 10 12 18   CREATININE 0.56 0.52 0.55 0.57 0.64 0.63  CALCIUM 9.7 8.6 8.8 8.9 8.4 8.5  MG 2.0  --   --   --   --   --    Liver Function Tests:  Recent Labs Lab 10/18/13 0358  AST 64*  ALT 24  ALKPHOS 123*  BILITOT 0.5  PROT 7.2  ALBUMIN 4.2   No results found for this basename: LIPASE, AMYLASE,  in the last 168 hours No results found for this basename: AMMONIA,  in the last 168 hours CBC:  Recent Labs Lab 10/18/13 0358 10/19/13 0542 10/20/13 0605 10/22/13  0601  WBC 15.7* 10.6* 9.9 12.8*  HGB 12.5 10.8* 11.1* 9.6*  HCT 35.9* 31.6* 32.8* 28.4*  MCV 86.1 86.6 87.2 87.9  PLT 315 291 325 317   Cardiac Enzymes: No results found for this basename: CKTOTAL, CKMB, CKMBINDEX, TROPONINI,  in the last 168 hours BNP (last 3 results) No results found for this basename: PROBNP,  in the last 8760 hours CBG:  Recent Labs Lab 10/18/13 0342  GLUCAP 111*     Recent Results (from the past 240 hour(s))  URINE CULTURE     Status: None   Collection Time    10/18/13 11:57 AM      Result Value Ref Range Status   Specimen Description URINE, CATHETERIZED   Final   Special Requests NONE   Final   Culture  Setup Time     Final   Value: 10/18/2013 17:32     Performed at [facility] Count     Final   Value: >=100,000 COLONIES/ML     Performed at [facility]   Culture     Final   Value: ENTEROCOCCUS SPECIES     Performed at [facility]   Report Status 10/22/2013 FINAL   Final   Organism ID, Bacteria ENTEROCOCCUS SPECIES   Final     Studies: No results found.  Scheduled Meds: . acetaminophen  650 mg Oral TID  . antiseptic oral rinse  15 mL Mouth Rinse q12n4p  . aspirin  81 mg Oral Daily  . chlorhexidine  15 mL Mouth Rinse BID  . diclofenac sodium  2 g Topical TID  . enoxaparin (LOVENOX) injection  40 mg Subcutaneous Q24H  . feeding supplement (ENSURE COMPLETE)  237 mL Oral TID BM  . fluticasone  1 spray Each Nare Daily  . levothyroxine  75 mcg Oral QAC breakfast  . liver oil-zinc oxide  1 application Topical TID  . loratadine  10 mg Oral QPM  . methylPREDNISolone acetate  80 mg Intra-articular Once  . metoprolol succinate  100 mg Oral Daily  . nitrofurantoin (macrocrystal-monohydrate)  100 mg Oral Q12H  . pantoprazole  40 mg Oral Daily  . polyethylene glycol  17 g Oral Daily   Continuous Infusions: . sodium chloride 0.9 % 1,000 mL infusion 10 mL/hr at 10/21/13 4098    Principal Problem:   Acute encephalopathy Active Problems:   Hyponatremia   UTI (lower urinary tract infection)   HYPOTHYROIDISM, POST-RADIOACTIVE IODINE   ANEMIA   ATHEROSCLEROTIC CARDIOVASCULAR DISEASE   Generalized weakness   Declining functional status   Fall   Dehydration   Dementia with behavioral disturbance   Fall at nursing home   FTT (failure to thrive) in adult   Right knee pain   Leukocytosis, unspecified    Encephalopathy acute   HTN (hypertension)    Time spent: 41 MINS    [facility] MD [facility] Pager [phone]. If 7PM-7AM, please contact night-coverage at [email or web address], password [facility] 10/22/2013, 12:24 PM  LOS: 4 days

## 2013-10-23 DIAGNOSIS — F0391 Unspecified dementia with behavioral disturbance: Secondary | ICD-10-CM

## 2013-10-23 DIAGNOSIS — F03918 Unspecified dementia, unspecified severity, with other behavioral disturbance: Secondary | ICD-10-CM

## 2013-10-23 LAB — CBC
HEMATOCRIT: 35.5 % — AB (ref 36.0–46.0)
HEMOGLOBIN: 11.7 g/dL — AB (ref 12.0–15.0)
MCH: 29.5 pg (ref 26.0–34.0)
MCHC: 33 g/dL (ref 30.0–36.0)
MCV: 89.4 fL (ref 78.0–100.0)
Platelets: 351 10*3/uL (ref 150–400)
RBC: 3.97 MIL/uL (ref 3.87–5.11)
RDW: 14 % (ref 11.5–15.5)
WBC: 12.8 10*3/uL — ABNORMAL HIGH (ref 4.0–10.5)

## 2013-10-23 LAB — BASIC METABOLIC PANEL
ANION GAP: 13 (ref 5–15)
BUN: 17 mg/dL (ref 6–23)
CHLORIDE: 91 meq/L — AB (ref 96–112)
CO2: 27 meq/L (ref 19–32)
Calcium: 9.1 mg/dL (ref 8.4–10.5)
Creatinine, Ser: 0.65 mg/dL (ref 0.50–1.10)
GFR calc non Af Amer: 74 mL/min — ABNORMAL LOW (ref >90)
GFR, EST AFRICAN AMERICAN: 86 mL/min — AB (ref >90)
Glucose, Bld: 112 mg/dL — ABNORMAL HIGH (ref 70–99)
POTASSIUM: 4.8 meq/L (ref 3.7–5.3)
Sodium: 131 mEq/L — ABNORMAL LOW (ref 137–147)

## 2013-10-23 MED ORDER — LOSARTAN POTASSIUM 50 MG PO TABS
100.0000 mg | ORAL_TABLET | Freq: Every day | ORAL | Status: DC
  Filled 2013-10-23: qty 2

## 2013-10-23 MED ORDER — ENSURE COMPLETE PO LIQD: 237.0000 mL | Freq: Three times a day (TID) | ORAL | Status: AC

## 2013-10-23 MED ORDER — LORAZEPAM 0.5 MG PO TABS: 0.5000 mg | ORAL_TABLET | Freq: Four times a day (QID) | ORAL | Status: DC | PRN

## 2013-10-23 MED ORDER — HYDRALAZINE HCL 20 MG/ML IJ SOLN
5.0000 mg | Freq: Once | INTRAMUSCULAR | Status: AC
  Administered 2013-10-23: 5 mg via INTRAVENOUS
  Filled 2013-10-23: qty 0.25

## 2013-10-23 MED ORDER — LEVOTHYROXINE SODIUM 75 MCG PO TABS: 75.0000 ug | ORAL_TABLET | Freq: Every day | ORAL | Status: DC

## 2013-10-23 MED ORDER — NITROFURANTOIN MONOHYD MACRO 100 MG PO CAPS: 100.0000 mg | ORAL_CAPSULE | Freq: Two times a day (BID) | ORAL | Status: DC

## 2013-10-23 NOTE — Care Management Note (Signed)
    Page 1 of 1   10/23/2013     4:02:17 PM CARE MANAGEMENT NOTE 10/23/2013  Patient:  Coralie CarpenDOYLE,Emmagrace Y   Account Number:  [id number]  Date Initiated:  10/19/2013  Documentation initiated by:  [facility],Kayleann Mccaffery  Subjective/Objective Assessment:   80 year old female admitted with UTI and falls.     [facility]   [facility]   Anticipated DC Date:  10/23/2013   Anticipated DC Plan:  SKILLED NURSING FACILITY  In-house referral  Clinical Social Worker      DC Planning Services  CM consult      Choice offered to / List presented to:             Status of service:  Completed, signed off Medicare Important Message given?  YES (If response is "NO", the following Medicare IM given date fields will be blank) Date Medicare IM given:  10/23/2013 Medicare IM given by:  [facility],Elizabella Nolet Date Additional Medicare IM given:   Additional Medicare IM given by:    Discharge Disposition:  SKILLED NURSING FACILITY  Per UR Regulation:  Reviewed for med. necessity/level of care/duration of stay  If discussed at Long Length of Stay Meetings, dates discussed:    Comments:

## 2013-10-23 NOTE — Progress Notes (Signed)
Checking oxygen on RA resting laying in bed. Pt stating at 90 to 95. Pt at 94 when leaving the room.

## 2013-10-23 NOTE — Progress Notes (Signed)
Clinical Social Work  [facility]. CSW alerted patient and son of DC plans. Son prefers [facility] to provide transportation and is aware of no guarantee of payment. CSW prepared DC packet with DNR, FL2, and DC summary included. RN to call report. [facility] #: [id number].  CSW is signing off but available if needed.  [place] MountainHolly [place], [place] [phone]

## 2013-10-23 NOTE — Progress Notes (Signed)
Patient's sister states that she brought the patients dentures placed  in a pink container on Friday evening.  She states she place this container on the bedside table and now the dentures are missing.  Room/belongings searched by family and staff.  Phoned Casimiro NeedleMichael, nutritional supervisor to see if any such container with dentures turned in to him.  Notified Risk Management of above.

## 2013-10-23 NOTE — Progress Notes (Addendum)
[facility] at [phone], staples on left lower calf/ankle to be removed at facility per Dr. Janee Mornhompson

## 2013-10-23 NOTE — Discharge Summary (Signed)
Physician Discharge Summary  Olivia Nixon OEU:[id number] DOB: 1917/04/29 DOA: 10/18/2013  PCP: Mathews Argyle, MD  Admit date: 10/18/2013 Discharge date: 10/23/2013  Time spent: 65 minutes  Recommendations for Outpatient Follow-up:  1. Patient be discharged to a skilled nursing facility other [facility]. Patient will followup with M.D. At SNF. Patient will need a basic metabolic profile done in one week to followup on electrolytes and renal function.  Discharge Diagnoses:  Principal Problem:   Acute encephalopathy Active Problems:   Hyponatremia   UTI (lower urinary tract infection)   HYPOTHYROIDISM, POST-RADIOACTIVE IODINE   ANEMIA   ATHEROSCLEROTIC CARDIOVASCULAR DISEASE   Generalized weakness   Declining functional status   Fall   Dehydration   Dementia with behavioral disturbance   Fall at nursing home   FTT (failure to thrive) in adult   Right knee pain   Leukocytosis, unspecified   Encephalopathy acute   HTN (hypertension)   Discharge Condition: Stable and improved  Diet recommendation: Regular/dysphagia 3  Filed Weights   10/20/13 0543 10/22/13 0519 10/23/13 0500  Weight: 51.7 kg (113 lb 15.7 oz) 52.3 kg (115 lb 4.8 oz) 51.5 kg (113 lb 8.6 oz)    History of present illness:  Olivia Nixon is a 80 y.o. female  Nursing home resident at [place], [place] and [place] with history of coronary artery disease status post CABG in May of 2000, hypertension, hyperlipidemia, hypothyroidism, diverticular disease, degenerative joint disease, dementia who presents to the ED with recurrent falls and sudden onset agitation at the facility. Patient was transferred to the ED. Patient is moaning and groaning and somewhat lethargic on the hospital bed with a history of dementia and not answering questions appropriately and as such history was obtained from patient's son. Patient also given 3 milligram of IV Ativan in the emergency room.  Per patient's son he was  called around 2:30 AM on the morning of admission that patient had fallen again and was highly agitated patient's son doesn't think patient has had any fevers, no chills, no diarrhea, no constipation, no chest pain, no shortness of breath, no melena, no hematemesis, no hematochezia, no diarrhea, no constipation, no abdominal pain. Patient's son does endorse some weakness and pain in the right knee such that patient cannot bear weight on the right knee secondary to arthritis. Patient's son also doesn't think patient has been eating well. Patient's son states that patient has been getting some injections in the right knee for arthritis and was being set up to be seen by orthopod for a knee injection.  Patient was seen in the emergency room urinalysis which was done was consistent with a UTI. Vital signs prior to transfer showed a temperature of 95 6 pulse of 72 respiration of 20 and a blood pressure 98/60 with sats of 97% on room air. Lab work obtained in the emergency room had a sodium of 124 chloride of 86 alk phosphatase of 123 AST of 64 otherwise complete metabolic profile is unremarkable. CBC done had a white count of 15.7 hemoglobin of 12.5 otherwise was within normal limits. Chest x-ray done was negative for any acute abnormalities. X-ray of the pelvis and the left tib-fib were negative for any acute fractures. EKG done showed a sinus tachycardia with LVH.  We were called to admit the patient for further evaluation and management.   Hospital Course:  #1 acute encephalopathy  On admission patient was noted to be agitated. It was felt patient's acute encephalopathy was secondary to  urinary tract infection and hyponatremia in the setting of underlying dementia. Patient was empirically placed on IV fluids as well as IV Rocephin and she was monitored. Urine cultures were also obtained. Patient improved clinically on a daily basis. Urine cultures with enterococcus species. IV Rocephin was subsequently changed  to oral Macrobid. Patient improved clinically and was at baseline by day of discharge. Patient will be discharged on 6 more days of oral Macrobid to complete a one-week course of antibiotic therapy.  #2 enterococcus urinary tract infection  On admission urinalysis which was done was consistent with a UTI. Patient was initially empirically placed on IV Rocephin while urine cultures were pending. Urine cultures with enterococcus. IV Rocephin was subsequently changed to oral Macrobid. Patient be discharged with 6 more days of oral Macrobid to complete a seven-day course of antibiotic therapy.  #3 right knee pain  Likely secondary to osteoarthritis. X-ray of the right knee consistent with severe osteoarthritis. Patient has been seen by orthopedics and patient has received a cortisone injection. PT/OT.  #4 hyponatremia  Likely secondary to hypovolemic hyponatremia. On admission patient was noted to be clinically dehydrated. Sodium levels on admission was 124. Patient was hydrated with IV fluids. Patient's Cozaar was held. Cortisol levels within normal limits. TSH slightly elevated. Patient's sodium levels improved with hydration. IV fluids and subsequently discontinued and patient was euvolemic. Patient's hyponatremia improved and patient was discharged in stable and improved condition. On day of discharge patient's sodium levels was 131. Outpatient followup.   #5 recurrent falls  PT/OT.  #6 leukocytosis  Likely secondary to problem #2. Chest x-ray is negative. Urine cultures with enterococcus species. On admission patient was placed empirically on IV Rocephin. Was urine cultures came back as enterococcus species antibiotics were changed to oral Macrobid. Patient will need 6 more days of oral Macrobid to complete a course of antibiotic therapy.  #7 failure to thrive  #8 dementia  #9 hypothyroidism  TSH slightly elevated at 6.390. Increased Synthroid dose to 75 mcg daily. Outpatient followup.  #10  hypertension  Continued on home dose beta blocker. Patient's ARB will be resumed on discharge.  #11 coronary artery disease status post CABG  Stable. Continued on home regimen beta blocker.      Procedures: CT head 10/18/2013  CT C-spine 10/18/2013  Chest x-ray 10/18/2013, 10/22/2013  X-ray of the pelvis 10/18/2013  X-ray of the left tib-fib 10/18/2013  X-ray right knee 10/19/2013     Consultations: Orthopedics: Dr. Alvan Dame 10/20/2013     Discharge Exam: Filed Vitals:   10/23/13 0623  BP: 187/85  Pulse:   Temp:   Resp:     General: NAD Cardiovascular: RRR Respiratory: CTAB  Discharge Instructions You were cared for by a hospitalist during your hospital stay. If you have any questions about your discharge medications or the care you received while you were in the hospital after you are discharged, you can call the unit and asked to speak with the hospitalist on call if the hospitalist that took care of you is not available. Once you are discharged, your primary care physician will handle any further medical issues. Please note that NO REFILLS for any discharge medications will be authorized once you are discharged, as it is imperative that you return to your primary care physician (or establish a relationship with a primary care physician if you do not have one) for your aftercare needs so that they can reassess your need for medications and monitor your lab values.  Discharge Instructions   Diet general    Complete by:  As directed      Discharge instructions    Complete by:  As directed   Follow up with Mathews Argyle, MD in 1 week.     Increase activity slowly    Complete by:  As directed             Medication List         acetaminophen 325 MG tablet  Commonly known as:  TYLENOL  Take 650 mg by mouth 3 (three) times daily.     aspirin 81 MG chewable tablet  Chew 81 mg by mouth daily.     diclofenac sodium 1 % Gel  Commonly known as:   VOLTAREN  Apply 2 g topically 3 (three) times daily. Applied to right knee     feeding supplement (ENSURE COMPLETE) Liqd  Take 237 mLs by mouth 3 (three) times daily between meals.     feeding supplement (PRO-STAT SUGAR FREE 64) Liqd  Take 30 mLs by mouth 2 (two) times daily.     fluticasone 50 MCG/ACT nasal spray  Commonly known as:  FLONASE  Place 1 spray into both nostrils daily.     hydrOXYzine 10 MG tablet  Commonly known as:  ATARAX/VISTARIL  Take 10 mg by mouth every 6 (six) hours as needed (for itching).     levocetirizine 5 MG tablet  Commonly known as:  XYZAL  Take 5 mg by mouth every evening.     levothyroxine 75 MCG tablet  Commonly known as:  SYNTHROID, LEVOTHROID  Take 1 tablet (75 mcg total) by mouth daily.     LORazepam 0.5 MG tablet  Commonly known as:  ATIVAN  Take 1 tablet (0.5 mg total) by mouth every 6 (six) hours as needed (agitation).     losartan 100 MG tablet  Commonly known as:  COZAAR  Take 100 mg by mouth daily.     metoprolol succinate 100 MG 24 hr tablet  Commonly known as:  TOPROL-XL  Take 100 mg by mouth daily. Take with or immediately following a meal.     MOISTURE BARRIER 5 % Crea  Generic drug:  DIMETHICONE (TOPICAL)  Apply 1 application topically 3 (three) times daily.     nitrofurantoin (macrocrystal-monohydrate) 100 MG capsule  Commonly known as:  MACROBID  Take 1 capsule (100 mg total) by mouth every 12 (twelve) hours. Take for 6 days then stop.     omeprazole 20 MG capsule  Commonly known as:  PRILOSEC  Take 20 mg by mouth daily as needed (for reflux).     polyethylene glycol packet  Commonly known as:  MIRALAX / GLYCOLAX  Take 17 g by mouth daily.       Allergies  Allergen Reactions  . Amoxicillin     PER MAR  . Bisacodyl     PER MAR  . Celebrex [Celecoxib]     PER MAR  . Chlorthalidone     PER MAR  . Ciprofloxacin     PER MAR  . Codeine     PER MAR  . Darvocet [Propoxyphene N-Acetaminophen]     PER MAR  .  Diclofenac Sodium     PER MAR  . Flagyl [Metronidazole Hcl]     PER MAR  . Ibuprofen     PER MAR  . Iodine     PER MAR  . Meloxicam     PER MAR  . Methocarbamol  PER MAR  . Oruvail [Ketoprofen]     PER MAR  . Ropinirole Hcl     PER MAR  . Sulfa Antibiotics     PER MAR  . Valium     PER MAR  . Zolpidem Tartrate     PER MAR   Follow-up Information   Follow up with Mathews Argyle, MD. Schedule an appointment as soon as possible for a visit in 1 week.   Specialty:  Internal Medicine   Contact information:   [address] [place] [address]        The results of significant diagnostics from this hospitalization (including imaging, microbiology, ancillary and laboratory) are listed below for reference.    Significant Diagnostic Studies: Dg Chest 2 View  10/18/2013   CLINICAL DATA:  FALL ALTERED MENTAL STATUS  EXAM: CHEST  2 VIEW  COMPARISON:  Prior radiograph from 06/06/2013  FINDINGS: Median sternotomy wires of underlying CABG markers and surgical clips are unchanged. Marked a tortuosity of the intrathoracic aorta is stable. Right were bowing of the trachea is unchanged. Cardiomegaly is stable.  Chronic emphysematous changes noted. No airspace consolidation, pleural effusion, or pulmonary edema is identified. There is no pneumothorax.  Diffuse osteopenia noted.  No acute osseous abnormality identified.  IMPRESSION: 1. No acute cardiopulmonary abnormality identified. 2. Stable cardiomegaly with sequelae of prior CABG. 3. Emphysematous changes.   Electronically Signed   By: Jeannine Boga M.D.   On: 10/18/2013 05:57   Dg Pelvis 1-2 Views  10/18/2013   CLINICAL DATA:  Fall.  Altered level of consciousness  EXAM: PELVIS - 1-2 VIEW  COMPARISON:  Abdominal CT 04/20/2003  FINDINGS: There is no evidence of pelvic fracture or diastasis. No other pelvic bone lesions are seen. There is generalized osteopenia. Diffuse atherosclerosis.  IMPRESSION: No acute  osseous abnormality.   Electronically Signed   By: Jorje Guild M.D.   On: 10/18/2013 05:56   Dg Tibia/fibula Left  10/18/2013   CLINICAL DATA:  FALL ALTERED MENTAL STATUS  EXAM: LEFT TIBIA AND FIBULA - 2 VIEW  COMPARISON:  None available.  FINDINGS: No acute fracture or dislocation identified. A cemented left total knee arthroplasty is in place. No periprosthetic lucency to suggest loosening or failure. Multiple surgical clips present within the left calf/leg. Vascular calcifications noted.  There is diffuse osteopenia.  IMPRESSION: 1. No acute fracture or dislocation. 2. Cemented right knee arthroplasty in place without evidence of hardware complication. 3. Diffuse osteopenia.   Electronically Signed   By: Jeannine Boga M.D.   On: 10/18/2013 05:54   Ct Head Wo Contrast  10/18/2013   CLINICAL DATA:  Fall with altered mental status  EXAM: CT HEAD WITHOUT CONTRAST  CT CERVICAL SPINE WITHOUT CONTRAST  TECHNIQUE: Multidetector CT imaging of the head and cervical spine was performed following the standard protocol without intravenous contrast. Multiplanar CT image reconstructions of the cervical spine were also generated.  COMPARISON:  06/05/2013  FINDINGS: CT HEAD FINDINGS  Skull and Sinuses:Negative for fracture or destructive process. Lobulated structure in the inferior left maxillary antrum likely represents a mucous retention cyst. Incidental torus palatini.  Orbits: Bilateral cataract resection.  Brain: No evidence of acute abnormality, such as acute infarction, hemorrhage, hydrocephalus, or mass lesion/mass effect. There is generalized brain atrophy. Stable pattern of moderate chronic small-vessel disease with ischemic gliosis confluent around the lateral ventricles and patchy throughout the deep cerebral white matter.  CT CERVICAL SPINE FINDINGS  No evidence of acute fracture.  No traumatic subluxation. There is chronic C4-5 anterolisthesis which is associated with advanced facet osteoarthritis.  No gross cervical canal hematoma or prevertebral edema. Degenerative changes are diffuse. Disc disease is most advanced at C5-6, C6-7, and T2-3 with marked disc narrowing and osteophytosis. Facet degeneration is most advanced in the upper cervical spine. There is osseous foraminal narrowing bilaterally at C3-4, C4-5, and C5-6.  There is a 3 to 4 cm mass within the left lobe of the thyroid gland which is likely incidental based on sonography and scintigraphy in 2007. The nodule persistence was again communicated on cervical spine CT March 2015.  IMPRESSION: 1. No evidence of acute intracranial or cervical spine injury. 2. Chronic/senescent changes are described above.   Electronically Signed   By: Jorje Guild M.D.   On: 10/18/2013 05:54   Ct Cervical Spine Wo Contrast  10/18/2013   CLINICAL DATA:  Fall with altered mental status  EXAM: CT HEAD WITHOUT CONTRAST  CT CERVICAL SPINE WITHOUT CONTRAST  TECHNIQUE: Multidetector CT imaging of the head and cervical spine was performed following the standard protocol without intravenous contrast. Multiplanar CT image reconstructions of the cervical spine were also generated.  COMPARISON:  06/05/2013  FINDINGS: CT HEAD FINDINGS  Skull and Sinuses:Negative for fracture or destructive process. Lobulated structure in the inferior left maxillary antrum likely represents a mucous retention cyst. Incidental torus palatini.  Orbits: Bilateral cataract resection.  Brain: No evidence of acute abnormality, such as acute infarction, hemorrhage, hydrocephalus, or mass lesion/mass effect. There is generalized brain atrophy. Stable pattern of moderate chronic small-vessel disease with ischemic gliosis confluent around the lateral ventricles and patchy throughout the deep cerebral white matter.  CT CERVICAL SPINE FINDINGS  No evidence of acute fracture. No traumatic subluxation. There is chronic C4-5 anterolisthesis which is associated with advanced facet osteoarthritis. No gross  cervical canal hematoma or prevertebral edema. Degenerative changes are diffuse. Disc disease is most advanced at C5-6, C6-7, and T2-3 with marked disc narrowing and osteophytosis. Facet degeneration is most advanced in the upper cervical spine. There is osseous foraminal narrowing bilaterally at C3-4, C4-5, and C5-6.  There is a 3 to 4 cm mass within the left lobe of the thyroid gland which is likely incidental based on sonography and scintigraphy in 2007. The nodule persistence was again communicated on cervical spine CT March 2015.  IMPRESSION: 1. No evidence of acute intracranial or cervical spine injury. 2. Chronic/senescent changes are described above.   Electronically Signed   By: Jorje Guild M.D.   On: 10/18/2013 05:54   Dg Pelvis Portable  10/20/2013   CLINICAL DATA:  Right hip pain after a fall  EXAM: PORTABLE PELVIS 1-2 VIEWS  COMPARISON:  10/18/2013  FINDINGS: Degenerative change in curvature of the lumbar spine is again partly visualized. Atheromatous aortic calcification without calcified aneurysm. Normal bowel gas pattern. No displaced pelvic fracture. The bones are subjectively osteopenic.  IMPRESSION: No displaced pelvic fracture identified.   Electronically Signed   By: Conchita Paris M.D.   On: 10/20/2013 10:16   Dg Chest Port 1 View  10/22/2013   CLINICAL DATA:  Cough and chest congestion. Coronary artery disease. Hypertension.  EXAM: PORTABLE CHEST - 1 VIEW  COMPARISON:  10/18/2013  FINDINGS: Mild cardiomegaly and ectasia of the thoracic aorta and great vessels are stable. Patient has undergone previous CABG.  Pulmonary hyperinflation again demonstrated. Increased pulmonary interstitial prominence is seen involving right lung greater than left, which may be due to mild interstitial edema or pneumonitis.  No evidence of pulmonary consolidation or pleural effusion.  IMPRESSION: Increased pulmonary interstitial prominence superimposed on COPD. This may be due to mild interstitial edema or  pneumonitis.  No evidence of pulmonary consolidation or pleural effusion.  Stable cardiomegaly and vascular ectasia.   Electronically Signed   By: Earle Gell M.D.   On: 10/22/2013 13:59   Dg Knee Complete 4 Views Right  10/19/2013   CLINICAL DATA:  Chronic right knee pain  EXAM: RIGHT KNEE - COMPLETE 4+ VIEW  COMPARISON:  None.  FINDINGS: No acute fracture or dislocation. Large joint effusion. Severe osteoarthritis of the patellofemoral compartment and medial femorotibial compartment. Moderate osteoarthritis of the lateral femorotibial compartment. No lytic or sclerotic osseous lesion. Peripheral vascular atherosclerotic disease.  IMPRESSION: 1. Tricompartmental osteoarthritis of the right knee most severe in the patellofemoral compartment and medial femorotibial compartment.   Electronically Signed   By: Kathreen Devoid   On: 10/19/2013 15:14    Microbiology: Recent Results (from the past 240 hour(s))  URINE CULTURE     Status: None   Collection Time    10/18/13 11:57 AM      Result Value Ref Range Status   Specimen Description URINE, CATHETERIZED   Final   Special Requests NONE   Final   Culture  Setup Time     Final   Value: 10/18/2013 17:32     Performed at [facility]     Final   Value: >=100,000 COLONIES/ML     Performed at [facility]   Culture     Final   Value: ENTEROCOCCUS SPECIES     Performed at [facility]   Report Status 10/22/2013 FINAL   Final   Organism ID, Bacteria ENTEROCOCCUS SPECIES   Final     Labs: Basic Metabolic Panel:  Recent Labs Lab 10/18/13 0358  10/19/13 1335 10/20/13 0605 10/21/13 0536 10/22/13 0601 10/23/13 0445  NA 124*  < > 129* 130* 132* 132* 131*  K 4.1  < > 4.0 3.7 4.0 4.7 4.8  CL 86*  < > 96 95* 98 99 91*  CO2 20  < > _0 GLUCOSE 118*  < > 104* 104* 158* 141* 112*  BUN 20  < > _1 CREATININE 0.56  < > 0.55 0.57 0.64 0.63 0.65  CALCIUM 9.7  < > 8.8 8.9 8.4 8.5 9.1  MG 2.0   --   --   --   --   --   --   < > = values in this interval not displayed. Liver Function Tests:  Recent Labs Lab 10/18/13 0358  AST 64*  ALT 24  ALKPHOS 123*  BILITOT 0.5  PROT 7.2  ALBUMIN 4.2   No results found for this basename: LIPASE, AMYLASE,  in the last 168 hours No results found for this basename: AMMONIA,  in the last 168 hours CBC:  Recent Labs Lab 10/18/13 0358 10/19/13 0542 10/20/13 0605 10/22/13 0601 10/23/13 0445  WBC 15.7* 10.6* 9.9 12.8* 12.8*  HGB 12.5 10.8* 11.1* 9.6* 11.7*  HCT 35.9* 31.6* 32.8* 28.4* 35.5*  MCV 86.1 86.6 87.2 87.9 89.4  PLT 315 291 325 317 351   Cardiac Enzymes: No results found for this basename: CKTOTAL, CKMB, CKMBINDEX, TROPONINI,  in the last 168 hours BNP: BNP (last 3 results) No results found for this basename: PROBNP,  in the last 8760 hours CBG:  Recent Labs  Lab 10/18/13 0342  GLUCAP 111*       SignedIrine Seal MD [facility] 10/23/2013, 12:20 PM

## 2014-01-16 ENCOUNTER — Other Ambulatory Visit (HOSPITAL_COMMUNITY): Payer: Self-pay | Admitting: Geriatric Medicine

## 2014-01-16 DIAGNOSIS — S71109A Unspecified open wound, unspecified thigh, initial encounter: Principal | ICD-10-CM

## 2014-01-16 DIAGNOSIS — S71009A Unspecified open wound, unspecified hip, initial encounter: Secondary | ICD-10-CM

## 2014-01-17 ENCOUNTER — Ambulatory Visit (HOSPITAL_COMMUNITY)
Admission: RE | Admit: 2014-01-17 | Discharge: 2014-01-17 | Disposition: A | Discharge status: Home / Self Care | Payer: Medicare Other | Source: Ambulatory Visit | Attending: Vascular Surgery | Admitting: Vascular Surgery

## 2014-01-17 DIAGNOSIS — S71009A Unspecified open wound, unspecified hip, initial encounter: Secondary | ICD-10-CM | POA: Insufficient documentation

## 2014-01-17 DIAGNOSIS — I70201 Unspecified atherosclerosis of native arteries of extremities, right leg: Secondary | ICD-10-CM | POA: Insufficient documentation

## 2014-01-17 DIAGNOSIS — S71109A Unspecified open wound, unspecified thigh, initial encounter: Secondary | ICD-10-CM

## 2014-01-17 DIAGNOSIS — X58XXXA Exposure to other specified factors, initial encounter: Secondary | ICD-10-CM | POA: Insufficient documentation

## 2014-01-17 NOTE — Progress Notes (Signed)
VASCULAR LAB PRELIMINARY  ARTERIAL  ABI completed:    RIGHT    LEFT    PRESSURE WAVEFORM  PRESSURE WAVEFORM  BRACHIAL 204 triphasic BRACHIAL 207  triphasic  DP   DP    AT >288 triphasic AT 222 triphasic  PT 223 triphasic PT 204 monophasic  PER   PER    GREAT TOE  adequate GREAT TOE  adequate    RIGHT LEFT  ABI N/A >1.0     Ian Castagna, RVT 01/17/2014, 5:18 PM

## 2016-01-01 ENCOUNTER — Inpatient Hospital Stay (HOSPITAL_COMMUNITY)
Admission: EM | Admit: 2016-01-01 | Discharge: 2016-01-07 | DRG: 378 | Disposition: A | Discharge status: Skilled Nursing Facility | Payer: Medicare Other | Attending: Internal Medicine | Admitting: Internal Medicine

## 2016-01-01 ENCOUNTER — Emergency Department (HOSPITAL_COMMUNITY): Payer: Medicare Other

## 2016-01-01 DIAGNOSIS — D62 Acute posthemorrhagic anemia: Secondary | ICD-10-CM | POA: Diagnosis present

## 2016-01-01 DIAGNOSIS — K921 Melena: Secondary | ICD-10-CM

## 2016-01-01 DIAGNOSIS — R1310 Dysphagia, unspecified: Secondary | ICD-10-CM

## 2016-01-01 DIAGNOSIS — Z79899 Other long term (current) drug therapy: Secondary | ICD-10-CM

## 2016-01-01 DIAGNOSIS — Z961 Presence of intraocular lens: Secondary | ICD-10-CM | POA: Diagnosis present

## 2016-01-01 DIAGNOSIS — Z8249 Family history of ischemic heart disease and other diseases of the circulatory system: Secondary | ICD-10-CM

## 2016-01-01 DIAGNOSIS — Z888 Allergy status to other drugs, medicaments and biological substances status: Secondary | ICD-10-CM

## 2016-01-01 DIAGNOSIS — K625 Hemorrhage of anus and rectum: Secondary | ICD-10-CM | POA: Diagnosis not present

## 2016-01-01 DIAGNOSIS — Z85828 Personal history of other malignant neoplasm of skin: Secondary | ICD-10-CM

## 2016-01-01 DIAGNOSIS — Z7982 Long term (current) use of aspirin: Secondary | ICD-10-CM

## 2016-01-01 DIAGNOSIS — E039 Hypothyroidism, unspecified: Secondary | ICD-10-CM | POA: Diagnosis present

## 2016-01-01 DIAGNOSIS — Z951 Presence of aortocoronary bypass graft: Secondary | ICD-10-CM

## 2016-01-01 DIAGNOSIS — D649 Anemia, unspecified: Secondary | ICD-10-CM | POA: Diagnosis present

## 2016-01-01 DIAGNOSIS — K224 Dyskinesia of esophagus: Secondary | ICD-10-CM

## 2016-01-01 DIAGNOSIS — K922 Gastrointestinal hemorrhage, unspecified: Secondary | ICD-10-CM | POA: Diagnosis not present

## 2016-01-01 DIAGNOSIS — E785 Hyperlipidemia, unspecified: Secondary | ICD-10-CM | POA: Diagnosis present

## 2016-01-01 DIAGNOSIS — E018 Other iodine-deficiency related thyroid disorders and allied conditions: Secondary | ICD-10-CM | POA: Diagnosis present

## 2016-01-01 DIAGNOSIS — K228 Other specified diseases of esophagus: Secondary | ICD-10-CM | POA: Diagnosis present

## 2016-01-01 DIAGNOSIS — I2510 Atherosclerotic heart disease of native coronary artery without angina pectoris: Secondary | ICD-10-CM | POA: Diagnosis present

## 2016-01-01 DIAGNOSIS — I10 Essential (primary) hypertension: Secondary | ICD-10-CM | POA: Diagnosis present

## 2016-01-01 DIAGNOSIS — H9190 Unspecified hearing loss, unspecified ear: Secondary | ICD-10-CM

## 2016-01-01 DIAGNOSIS — Z9849 Cataract extraction status, unspecified eye: Secondary | ICD-10-CM

## 2016-01-01 DIAGNOSIS — E876 Hypokalemia: Secondary | ICD-10-CM | POA: Diagnosis not present

## 2016-01-01 LAB — COMPREHENSIVE METABOLIC PANEL
ALK PHOS: 83 U/L (ref 38–126)
ALT: 7 U/L — ABNORMAL LOW (ref 14–54)
AST: 13 U/L — ABNORMAL LOW (ref 15–41)
Albumin: 3.4 g/dL — ABNORMAL LOW (ref 3.5–5.0)
Anion gap: 7 (ref 5–15)
BUN: 14 mg/dL (ref 6–20)
CALCIUM: 8.5 mg/dL — AB (ref 8.9–10.3)
CO2: 23 mmol/L (ref 22–32)
Chloride: 104 mmol/L (ref 101–111)
Creatinine, Ser: 0.56 mg/dL (ref 0.44–1.00)
Glucose, Bld: 108 mg/dL — ABNORMAL HIGH (ref 65–99)
Potassium: 4.2 mmol/L (ref 3.5–5.1)
Sodium: 134 mmol/L — ABNORMAL LOW (ref 135–145)
Total Bilirubin: 0.3 mg/dL (ref 0.3–1.2)
Total Protein: 6.6 g/dL (ref 6.5–8.1)

## 2016-01-01 LAB — CBC
HCT: 34.4 % — ABNORMAL LOW (ref 36.0–46.0)
HEMOGLOBIN: 11.3 g/dL — AB (ref 12.0–15.0)
MCH: 29.2 pg (ref 26.0–34.0)
MCHC: 32.8 g/dL (ref 30.0–36.0)
MCV: 88.9 fL (ref 78.0–100.0)
Platelets: 303 10*3/uL (ref 150–400)
RBC: 3.87 MIL/uL (ref 3.87–5.11)
RDW: 14.3 % (ref 11.5–15.5)
WBC: 12.2 10*3/uL — ABNORMAL HIGH (ref 4.0–10.5)

## 2016-01-01 LAB — POC OCCULT BLOOD, ED: FECAL OCCULT BLD: POSITIVE — AB

## 2016-01-01 LAB — PROTIME-INR
INR: 0.95
PROTHROMBIN TIME: 12.7 s (ref 11.4–15.2)

## 2016-01-01 MED ORDER — DIPHENHYDRAMINE HCL 25 MG PO CAPS
25.0000 mg | ORAL_CAPSULE | Freq: Once | ORAL | Status: AC
  Administered 2016-01-02: 25 mg via ORAL
  Filled 2016-01-01: qty 1

## 2016-01-01 MED ORDER — SODIUM CHLORIDE 0.9 % IV BOLUS (SEPSIS)
500.0000 mL | Freq: Once | INTRAVENOUS | Status: AC
  Administered 2016-01-02: 500 mL via INTRAVENOUS

## 2016-01-01 MED ORDER — HYDROCORTISONE NA SUCCINATE PF 100 MG IJ SOLR
200.0000 mg | Freq: Once | INTRAMUSCULAR | Status: AC
  Administered 2016-01-02: 200 mg via INTRAVENOUS
  Filled 2016-01-01: qty 4

## 2016-01-01 NOTE — ED Provider Notes (Signed)
[facility]-EMERGENCY DEPT Provider Note   CSN: [id number] Arrival date & time: 01/01/16  2041     History   Chief Complaint Chief Complaint  Patient presents with  . Rectal Bleeding    HPI Olivia Nixon is a 82 y.o. female.  HPI  82 year old female with a history of dementia and hearing loss presents with bleeding in her stool. History is taken from the son, the patient, and EMS. The facility reported to EMS that tonight she all of a sudden had a large bowel movement with blood mixed in. To the patient's knowledge and the son's knowledge this is the first on this is ever happened. She denies abdominal pain. There has been no apparent vomiting. Patient is not on a blood thinner. The history is limited by the patient's severe hearing loss. Patient tells that this week she has had 2 large bowel movements, does not believe there was blood in them.  Past Medical History:  Diagnosis Date  . Anemia, mild    Hemoglobin of 11.5-12  . ASCVD (arteriosclerotic cardiovascular disease)    CABG surgery in 5/00  . Dementia with behavioral disturbance 10/18/2013  . Diverticular disease   . DJD (degenerative joint disease)    Left knee  . Hearing loss   . HTN (hypertension) 10/18/2013  . Hyperlipidemia   . Hypertension    Unspecified  . Hypothyroid    Initially presented with toxic goiter; levothyroxine Rx needed after RAI in 2008    Patient Active Problem List   Diagnosis Date Noted  . Rectal bleeding 01/01/2016  . Acute encephalopathy 10/18/2013  . UTI (lower urinary tract infection) 10/18/2013  . Fall 10/18/2013  . Dehydration 10/18/2013  . Dementia with behavioral disturbance 10/18/2013  . Fall at nursing home 10/18/2013  . FTT (failure to thrive) in adult 10/18/2013  . Right knee pain 10/18/2013  . Leukocytosis, unspecified 10/18/2013  . Encephalopathy acute 10/18/2013  . HTN (hypertension) 10/18/2013  . UTI (urinary tract infection) 06/08/2013  . Declining functional status  06/08/2013  . Hyponatremia 06/08/2013  . Generalized weakness 06/07/2013  . HYPOTHYROIDISM, POST-RADIOACTIVE IODINE 12/06/2009  . ANEMIA 12/06/2009  . ATHEROSCLEROTIC CARDIOVASCULAR DISEASE 11/07/2008  . DECREASED HEARING 10/01/2008  . DIVERTICULAR DISEASE 10/01/2008  . DEGENERATIVE JOINT DISEASE, LEFT KNEE 10/01/2008    Past Surgical History:  Procedure Laterality Date  . CATARACT EXTRACTION     with lens implantation  . CORONARY ARTERY BYPASS GRAFT  08/1998  . MELANOMA EXCISION     from right arm  . TONSILLECTOMY AND ADENOIDECTOMY    . TOTAL ABDOMINAL HYSTERECTOMY      OB History    No data available       Home Medications    Prior to Admission medications   Medication Sig Start Date End Date Taking? Authorizing Provider  acetaminophen (TYLENOL) 325 MG tablet Take 650 mg by mouth 3 (three) times daily.   Yes Historical Provider, MD  aspirin 81 MG chewable tablet Chew 81 mg by mouth daily.   Yes Historical Provider, MD  cycloSPORINE (RESTASIS) 0.05 % ophthalmic emulsion Place 1 drop into both eyes 2 (two) times daily.   Yes Historical Provider, MD  diclofenac sodium (VOLTAREN) 1 % GEL Apply 2 g topically 3 (three) times daily. Applied to right knee   Yes Historical Provider, MD  levocetirizine (XYZAL) 5 MG tablet Take 5 mg by mouth daily as needed for allergies.    Yes Historical Provider, MD  levothyroxine (SYNTHROID, LEVOTHROID) 100 MCG tablet Take  100 mcg by mouth daily before breakfast.   Yes Historical Provider, MD  losartan (COZAAR) 50 MG tablet Take 50 mg by mouth daily.   Yes Historical Provider, MD  metoprolol succinate (TOPROL-XL) 100 MG 24 hr tablet Take 100 mg by mouth daily. Take with or immediately following a meal.   Yes Historical Provider, MD  polyethylene glycol (MIRALAX / GLYCOLAX) packet Take 17 g by mouth daily.   Yes Historical Provider, MD  traMADol (ULTRAM) 50 MG tablet Take 50 mg by mouth daily.   Yes Historical Provider, MD  feeding supplement,  ENSURE COMPLETE, (ENSURE COMPLETE) LIQD Take 237 mLs by mouth 3 (three) times daily between meals. Patient not taking: Reported on 01/01/2016 10/23/13   Rodolph Bong, MD  levothyroxine (SYNTHROID, LEVOTHROID) 75 MCG tablet Take 1 tablet (75 mcg total) by mouth daily. Patient not taking: Reported on 01/01/2016 10/23/13   Rodolph Bong, MD  LORazepam (ATIVAN) 0.5 MG tablet Take 1 tablet (0.5 mg total) by mouth every 6 (six) hours as needed (agitation). Patient not taking: Reported on 01/01/2016 10/23/13   Rodolph Bong, MD  nitrofurantoin, macrocrystal-monohydrate, (MACROBID) 100 MG capsule Take 1 capsule (100 mg total) by mouth every 12 (twelve) hours. Take for 6 days then stop. Patient not taking: Reported on 01/01/2016 10/23/13   Rodolph Bong, MD    Family History No family history on file.  Social History Social History  Substance Use Topics  . Smoking status: Never Smoker  . Smokeless tobacco: Not on file  . Alcohol use No     Allergies   Amoxicillin; Bisacodyl; Celebrex [celecoxib]; Chlorthalidone; Ciprofloxacin; Codeine; Darvocet [propoxyphene n-acetaminophen]; Diclofenac sodium; Flagyl [metronidazole hcl]; Ibuprofen; Iodine; Meloxicam; Methocarbamol; Oruvail [ketoprofen]; Ropinirole hcl; Sulfa antibiotics; Valium; and Zolpidem tartrate   Review of Systems Review of Systems  Unable to perform ROS: Dementia     Physical Exam Updated Vital Signs BP 164/90 (BP Location: Right Arm)   Pulse 81   Temp 98.3 F (36.8 C) (Oral)   Resp 18   SpO2 97%   Physical Exam  Constitutional: She appears well-developed and well-nourished.  Very hard of hearing  HENT:  Head: Normocephalic and atraumatic.  Right Ear: External ear normal.  Left Ear: External ear normal.  Nose: Nose normal.  Eyes: Right eye exhibits no discharge. Left eye exhibits no discharge.  Cardiovascular: Normal rate, regular rhythm and normal heart sounds.   Pulmonary/Chest: Effort normal and breath  sounds normal.  Abdominal: Soft. There is tenderness (diffuse).  Diffusely firm abdomen with tenderness. No distention  Genitourinary:  Genitourinary Comments: Large amount of bright red blood and stool in patient's diaper. Small nonthrombosed hemorrhoids. Gross blood on finger on digital exam. There is no obvious impaction or lesions in her rectum.  Neurological: She is alert.  Skin: Skin is warm and dry.  Nursing note and vitals reviewed.    ED Treatments / Results  Labs (all labs ordered are listed, but only abnormal results are displayed) Labs Reviewed  CBC - Abnormal; Notable for the following:       Result Value   WBC 12.2 (*)    Hemoglobin 11.3 (*)    HCT 34.4 (*)    All other components within normal limits  COMPREHENSIVE METABOLIC PANEL - Abnormal; Notable for the following:    Sodium 134 (*)    Glucose, Bld 108 (*)    Calcium 8.5 (*)    Albumin 3.4 (*)    AST 13 (*)  ALT 7 (*)    All other components within normal limits  POC OCCULT BLOOD, ED - Abnormal; Notable for the following:    Fecal Occult Bld POSITIVE (*)    All other components within normal limits  PROTIME-INR  I-STAT CG4 LACTIC ACID, ED    EKG  EKG Interpretation None       Radiology No results found.  Procedures Procedures (including critical care time)  Medications Ordered in ED Medications  hydrocortisone sodium succinate (SOLU-CORTEF) 100 MG injection 200 mg (not administered)  diphenhydrAMINE (BENADRYL) capsule 25 mg (not administered)  sodium chloride 0.9 % bolus 500 mL (not administered)     Initial Impression / Assessment and Plan / ED Course  I have reviewed the triage vital signs and the nursing notes.  Pertinent labs & imaging results that were available during my care of the patient were reviewed by me and considered in my medical decision making (see chart for details).  Clinical Course  Comment By Time  Will get CT given firm abdomen with tenderness. However without  palpation she has no pain. Unable to do rectal exam as patient is currently in hallway, when she is in a regular room will eval further Pricilla Loveless, MD 09/27 2215  Patient's MAR shows iodine allergy. CT requests hydrocortisone and benadryl for emergent pre-medication. Will consult hospitalist as she will need admission either way. Pricilla Loveless, MD 09/27 2322    Final Clinical Impressions(s) / ED Diagnoses   Final diagnoses:  Rectal bleeding    New Prescriptions New Prescriptions   No medications on file     Pricilla Loveless, MD 01/01/16 2335

## 2016-01-01 NOTE — ED Triage Notes (Signed)
Pt BIB EMS from [facility];pt had BM and staff noticed reddish color; sample tested positive for occult blood; pt has hx of constipation and dementia; answers questions appropriately

## 2016-01-02 ENCOUNTER — Observation Stay (HOSPITAL_COMMUNITY): Payer: Medicare Other

## 2016-01-02 ENCOUNTER — Encounter (HOSPITAL_COMMUNITY): Payer: Self-pay | Admitting: Internal Medicine

## 2016-01-02 DIAGNOSIS — I10 Essential (primary) hypertension: Secondary | ICD-10-CM | POA: Diagnosis present

## 2016-01-02 DIAGNOSIS — I2510 Atherosclerotic heart disease of native coronary artery without angina pectoris: Secondary | ICD-10-CM | POA: Diagnosis present

## 2016-01-02 DIAGNOSIS — Z951 Presence of aortocoronary bypass graft: Secondary | ICD-10-CM | POA: Diagnosis not present

## 2016-01-02 DIAGNOSIS — K921 Melena: Secondary | ICD-10-CM | POA: Diagnosis present

## 2016-01-02 DIAGNOSIS — H9190 Unspecified hearing loss, unspecified ear: Secondary | ICD-10-CM | POA: Diagnosis present

## 2016-01-02 DIAGNOSIS — E785 Hyperlipidemia, unspecified: Secondary | ICD-10-CM | POA: Diagnosis present

## 2016-01-02 DIAGNOSIS — Z961 Presence of intraocular lens: Secondary | ICD-10-CM | POA: Diagnosis present

## 2016-01-02 DIAGNOSIS — R1310 Dysphagia, unspecified: Secondary | ICD-10-CM | POA: Diagnosis not present

## 2016-01-02 DIAGNOSIS — D649 Anemia, unspecified: Secondary | ICD-10-CM | POA: Diagnosis not present

## 2016-01-02 DIAGNOSIS — Z8249 Family history of ischemic heart disease and other diseases of the circulatory system: Secondary | ICD-10-CM | POA: Diagnosis not present

## 2016-01-02 DIAGNOSIS — K228 Other specified diseases of esophagus: Secondary | ICD-10-CM | POA: Diagnosis present

## 2016-01-02 DIAGNOSIS — Z7982 Long term (current) use of aspirin: Secondary | ICD-10-CM | POA: Diagnosis not present

## 2016-01-02 DIAGNOSIS — E039 Hypothyroidism, unspecified: Secondary | ICD-10-CM | POA: Diagnosis present

## 2016-01-02 DIAGNOSIS — E876 Hypokalemia: Secondary | ICD-10-CM | POA: Diagnosis not present

## 2016-01-02 DIAGNOSIS — Z79899 Other long term (current) drug therapy: Secondary | ICD-10-CM | POA: Diagnosis not present

## 2016-01-02 DIAGNOSIS — Z9849 Cataract extraction status, unspecified eye: Secondary | ICD-10-CM | POA: Diagnosis not present

## 2016-01-02 DIAGNOSIS — K922 Gastrointestinal hemorrhage, unspecified: Secondary | ICD-10-CM | POA: Diagnosis present

## 2016-01-02 DIAGNOSIS — Z85828 Personal history of other malignant neoplasm of skin: Secondary | ICD-10-CM | POA: Diagnosis not present

## 2016-01-02 DIAGNOSIS — Z888 Allergy status to other drugs, medicaments and biological substances status: Secondary | ICD-10-CM | POA: Diagnosis not present

## 2016-01-02 DIAGNOSIS — K224 Dyskinesia of esophagus: Secondary | ICD-10-CM | POA: Diagnosis present

## 2016-01-02 DIAGNOSIS — D62 Acute posthemorrhagic anemia: Secondary | ICD-10-CM | POA: Diagnosis present

## 2016-01-02 DIAGNOSIS — K625 Hemorrhage of anus and rectum: Secondary | ICD-10-CM | POA: Diagnosis present

## 2016-01-02 LAB — BASIC METABOLIC PANEL
ANION GAP: 9 (ref 5–15)
BUN: 17 mg/dL (ref 6–20)
CALCIUM: 8.6 mg/dL — AB (ref 8.9–10.3)
CHLORIDE: 100 mmol/L — AB (ref 101–111)
CO2: 24 mmol/L (ref 22–32)
CREATININE: 0.69 mg/dL (ref 0.44–1.00)
GFR calc Af Amer: >60 mL/min (ref >60)
GFR calc non Af Amer: >60 mL/min (ref >60)
Glucose, Bld: 178 mg/dL — ABNORMAL HIGH (ref 65–99)
POTASSIUM: 4.4 mmol/L (ref 3.5–5.1)
Sodium: 133 mmol/L — ABNORMAL LOW (ref 135–145)

## 2016-01-02 LAB — COMPREHENSIVE METABOLIC PANEL
ALK PHOS: 89 U/L (ref 38–126)
ALT: 8 U/L — AB (ref 14–54)
ANION GAP: 9 (ref 5–15)
AST: 15 U/L (ref 15–41)
Albumin: 3.4 g/dL — ABNORMAL LOW (ref 3.5–5.0)
BILIRUBIN TOTAL: 0.2 mg/dL — AB (ref 0.3–1.2)
BUN: 17 mg/dL (ref 6–20)
CALCIUM: 8.8 mg/dL — AB (ref 8.9–10.3)
CO2: 24 mmol/L (ref 22–32)
CREATININE: 0.61 mg/dL (ref 0.44–1.00)
Chloride: 100 mmol/L — ABNORMAL LOW (ref 101–111)
GFR calc non Af Amer: >60 mL/min (ref >60)
GLUCOSE: 132 mg/dL — AB (ref 65–99)
Potassium: 4.3 mmol/L (ref 3.5–5.1)
SODIUM: 133 mmol/L — AB (ref 135–145)
TOTAL PROTEIN: 6.4 g/dL — AB (ref 6.5–8.1)

## 2016-01-02 LAB — CBC
HCT: 29.2 % — ABNORMAL LOW (ref 36.0–46.0)
HCT: 27.8 % — ABNORMAL LOW (ref 36.0–46.0)
HEMOGLOBIN: 9.6 g/dL — AB (ref 12.0–15.0)
Hemoglobin: 9.4 g/dL — ABNORMAL LOW (ref 12.0–15.0)
MCH: 28.4 pg (ref 26.0–34.0)
MCH: 29.7 pg (ref 26.0–34.0)
MCHC: 32.9 g/dL (ref 30.0–36.0)
MCHC: 33.8 g/dL (ref 30.0–36.0)
MCV: 86.4 fL (ref 78.0–100.0)
MCV: 88 fL (ref 78.0–100.0)
PLATELETS: 314 10*3/uL (ref 150–400)
PLATELETS: 291 10*3/uL (ref 150–400)
RBC: 3.38 MIL/uL — ABNORMAL LOW (ref 3.87–5.11)
RBC: 3.16 MIL/uL — AB (ref 3.87–5.11)
RDW: 14.3 % (ref 11.5–15.5)
RDW: 14.3 % (ref 11.5–15.5)
WBC: 14.8 10*3/uL — ABNORMAL HIGH (ref 4.0–10.5)
WBC: 11.1 10*3/uL — AB (ref 4.0–10.5)

## 2016-01-02 LAB — GLUCOSE, CAPILLARY: GLUCOSE-CAPILLARY: 93 mg/dL (ref 65–99)

## 2016-01-02 LAB — MRSA PCR SCREENING: MRSA by PCR: NEGATIVE

## 2016-01-02 MED ORDER — LOSARTAN POTASSIUM 50 MG PO TABS
50.0000 mg | ORAL_TABLET | Freq: Every day | ORAL | Status: DC
  Administered 2016-01-02 – 2016-01-07 (×4): 50 mg via ORAL
  Filled 2016-01-02 (×4): qty 1

## 2016-01-02 MED ORDER — LEVOTHYROXINE SODIUM 100 MCG PO TABS
100.0000 ug | ORAL_TABLET | Freq: Every day | ORAL | Status: DC
  Administered 2016-01-02 – 2016-01-07 (×3): 100 ug via ORAL
  Filled 2016-01-02 (×3): qty 1

## 2016-01-02 MED ORDER — METOPROLOL SUCCINATE ER 100 MG PO TB24
100.0000 mg | ORAL_TABLET | Freq: Every day | ORAL | Status: DC
  Administered 2016-01-02 – 2016-01-07 (×4): 100 mg via ORAL
  Filled 2016-01-02 (×4): qty 1

## 2016-01-02 MED ORDER — ACETAMINOPHEN 650 MG RE SUPP: 650.0000 mg | Freq: Four times a day (QID) | RECTAL | Status: DC | PRN

## 2016-01-02 MED ORDER — IOPAMIDOL (ISOVUE-300) INJECTION 61%
15.0000 mL | Freq: Once | INTRAVENOUS | Status: AC | PRN
  Administered 2016-01-02: 15 mL via ORAL

## 2016-01-02 MED ORDER — ACETAMINOPHEN 325 MG PO TABS
650.0000 mg | ORAL_TABLET | Freq: Four times a day (QID) | ORAL | Status: DC | PRN
  Administered 2016-01-06 – 2016-01-07 (×2): 650 mg via ORAL
  Filled 2016-01-02 (×2): qty 2

## 2016-01-02 MED ORDER — LORATADINE 10 MG PO TABS: 10.0000 mg | ORAL_TABLET | Freq: Every day | ORAL | Status: DC | PRN

## 2016-01-02 MED ORDER — CYCLOSPORINE 0.05 % OP EMUL
1.0000 [drp] | Freq: Two times a day (BID) | OPHTHALMIC | Status: DC
  Administered 2016-01-02 – 2016-01-07 (×10): 1 [drp] via OPHTHALMIC
  Filled 2016-01-02 (×13): qty 1

## 2016-01-02 MED ORDER — ONDANSETRON HCL 4 MG PO TABS: 4.0000 mg | ORAL_TABLET | Freq: Four times a day (QID) | ORAL | Status: DC | PRN

## 2016-01-02 MED ORDER — SODIUM CHLORIDE 0.9 % IV SOLN
INTRAVENOUS | Status: AC
  Administered 2016-01-02: 05:00:00 via INTRAVENOUS

## 2016-01-02 MED ORDER — IOPAMIDOL (ISOVUE-300) INJECTION 61%
100.0000 mL | Freq: Once | INTRAVENOUS | Status: AC | PRN
  Administered 2016-01-02: 80 mL via INTRAVENOUS

## 2016-01-02 MED ORDER — ONDANSETRON HCL 4 MG/2ML IJ SOLN
4.0000 mg | Freq: Four times a day (QID) | INTRAMUSCULAR | Status: DC | PRN
  Administered 2016-01-02 – 2016-01-03 (×2): 4 mg via INTRAVENOUS
  Filled 2016-01-02 (×2): qty 2

## 2016-01-02 MED ORDER — DIPHENHYDRAMINE HCL 25 MG PO CAPS
ORAL_CAPSULE | ORAL | Status: AC
  Administered 2016-01-02: 25 mg
  Filled 2016-01-02: qty 1

## 2016-01-02 NOTE — Care Management Obs Status (Signed)
MEDICARE OBSERVATION STATUS NOTIFICATION   Patient Details  Name: Olivia Nixon MRN: [id number] Date of Birth: 04/19/1917   Medicare Observation Status Notification Given:  Yes    MahabirOlegario Messier, Ariyan Sinnett, RN 01/02/2016, 12:56 PM

## 2016-01-02 NOTE — Progress Notes (Signed)
Pt admitted after midnight for evaluation of acute blood loss anemia, GI bleed, FOBT +. Hg drop from 11 --> 9.4. Has not gotten any blood yet. Please earlier admission note by Dr. Toniann FailKakrakandy. Repeat CBC pending this afternoon, if persistnet drop in Hg, will need GI consult.   Debbora PrestoMAGICK-Edwardo Wojnarowski, MD  [facility] Pager [phone]  If 7PM-7AM, please contact night-coverage [email or web address] Password [email or web address]

## 2016-01-02 NOTE — NC FL2 (Signed)
[place] MEDICA[place] FL2 LEVEL OF CARE SCREENING TOOL     IDENTIFICATION  Patient Name: Olivia Nixon Birthdate: 10/21/1917 Sex: female Admission Date (Current Location): 01/01/2016  [facility] [place]   Facility and Address:  [facility],  [address], [place] [id number]      Provider Number: [id number]  Attending Physician Name and Address:  Dorothea OgleIskra M Myers, MD  Relative Name and Phone Number:       Current Level of Care: Hospital Recommended Level of Care: Skilled Nursing Facility Prior Approval Number:    Date Approved/Denied:   PASRR Number:   [id number]  Discharge Plan: SNF    Current Diagnoses: Patient Active Problem List   Diagnosis Date Noted  . Rectal bleeding 01/01/2016  . Acute encephalopathy 10/18/2013  . UTI (lower urinary tract infection) 10/18/2013  . Fall 10/18/2013  . Dehydration 10/18/2013  . Dementia with behavioral disturbance 10/18/2013  . Fall at nursing home 10/18/2013  . FTT (failure to thrive) in adult 10/18/2013  . Right knee pain 10/18/2013  . Leukocytosis, unspecified 10/18/2013  . Encephalopathy acute 10/18/2013  . HTN (hypertension) 10/18/2013  . UTI (urinary tract infection) 06/08/2013  . Declining functional status 06/08/2013  . Hyponatremia 06/08/2013  . Generalized weakness 06/07/2013  . HYPOTHYROIDISM, POST-RADIOACTIVE IODINE 12/06/2009  . Normochromic normocytic anemia 12/06/2009  . ATHEROSCLEROTIC CARDIOVASCULAR DISEASE 11/07/2008  . Hearing loss 10/01/2008  . DIVERTICULAR DISEASE 10/01/2008  . DEGENERATIVE JOINT DISEASE, LEFT KNEE 10/01/2008    Orientation RESPIRATION BLADDER Height & Weight     Self, Situation, Place  Normal Continent Weight: 107 lb 12.9 oz (48.9 kg) Height:  5\' 3"  (160 cm)  BEHAVIORAL SYMPTOMS/MOOD NEUROLOGICAL BOWEL NUTRITION STATUS      Continent Diet (Soft)  AMBULATORY STATUS COMMUNICATION OF NEEDS Skin   Extensive Assist Verbally Normal                        Personal Care Assistance Level of Assistance  Bathing Bathing Assistance: Limited assistance   Dressing Assistance: Limited assistance     Functional Limitations Info  Hearing   Hearing Info: Impaired      SPECIAL CARE FACTORS FREQUENCY                       Contractures      Additional Factors Info  Code Status, Allergies   Allergies Info: Amoxicillin, Bisacodyl, Celebrex Celecoxib, Chlorthalidone, Ciprofloxacin, Codeine, Darvocet Propoxyphene N-acetaminophen, Diclofenac Sodium, Flagyl Metronidazole Hcl, Ibuprofen, Iodine, Meloxicam, Methocarbamol, Oruvail Ketoprofen, Ropinirole Hcl, Sulfa Antibiotics, Valium, Zolpidem Tartrate           Current Medications (01/02/2016):  This is the current hospital active medication list Current Facility-Administered Medications  Medication Dose Route Frequency Provider Last Rate Last Dose  . 0.9 %  sodium chloride infusion   Intravenous Continuous Eduard ClosArshad N Kakrakandy, MD 50 mL/hr at 01/02/16 0450    . acetaminophen (TYLENOL) tablet 650 mg  650 mg Oral Q6H PRN Eduard ClosArshad N Kakrakandy, MD       Or  . acetaminophen (TYLENOL) suppository 650 mg  650 mg Rectal Q6H PRN Eduard ClosArshad N Kakrakandy, MD      . cycloSPORINE (RESTASIS) 0.05 % ophthalmic emulsion 1 drop  1 drop Both Eyes BID Eduard ClosArshad N Kakrakandy, MD      . levothyroxine (SYNTHROID, LEVOTHROID) tablet 100 mcg  100 mcg Oral QAC breakfast Eduard ClosArshad N Kakrakandy, MD   100 mcg at 01/02/16 0800  .  loratadine (CLARITIN) tablet 10 mg  10 mg Oral Daily PRN Eduard Clos, MD      . losartan (COZAAR) tablet 50 mg  50 mg Oral Daily Eduard Clos, MD   50 mg at 01/02/16 0949  . metoprolol succinate (TOPROL-XL) 24 hr tablet 100 mg  100 mg Oral Daily Eduard Clos, MD   100 mg at 01/02/16 0948  . ondansetron (ZOFRAN) tablet 4 mg  4 mg Oral Q6H PRN Eduard Clos, MD       Or  . ondansetron [facility]) injection 4 mg  4 mg Intravenous Q6H PRN Eduard Clos, MD          Discharge Medications: Please see discharge summary for a list of discharge medications.  Relevant Imaging Results:  Relevant Lab Results:   Additional Information (P) SSN: [id number]  Arlyss Repress, LCSW

## 2016-01-02 NOTE — Clinical Social Work Note (Signed)
Clinical Social Work Assessment  Patient Details  Name: Olivia Nixon MRN: [id number] Date of Birth: 12/24/1917  Date of referral:  01/02/16               Reason for consult:  Facility Placement                Permission sought to share information with:  Facility Industrial/product designerContact Representative Permission granted to share information::  Yes, Verbal Permission Granted  Name::        Agency::     Relationship::     Contact Information:     Housing/Transportation Living arrangements for the past 2 months:  Skilled Nursing Facility Source of Information:  Patient, Adult Children Patient Interpreter Needed:  None Criminal Activity/Legal Involvement Pertinent to Current Situation/Hospitalization:  No - Comment as needed Significant Relationships:  Adult Children Lives with:  Facility Resident Do you feel safe going back to the place where you live?  Yes Need for family participation in patient care:  Yes (Comment)  Care giving concerns:  CSW received consult that patient was admitted from [facility].    Social Worker assessment / plan:  CSW confirmed with Tresa EndoKelly at [facility] that patient is a long term resident there. Patient & son states that she plans to return there at discharge.   Employment status:  Retired Health and safety inspectornsurance information:  Medicare PT Recommendations:  Not assessed at this time Information / Referral to community resources:     Patient/Family's Response to care:  Patient's son informed CSW that prior to [facility], she was at [place] SNF in [place] but has been very pleased with [facility] where she has been living the past 2 years.   Patient/Family's Understanding of and Emotional Response to Diagnosis, Current Treatment, and Prognosis:    Emotional Assessment Appearance:    Attitude/Demeanor/Rapport:    Affect (typically observed):    Orientation:  Oriented to Self, Oriented to Place, Oriented to Situation Alcohol / Substance use:    Psych involvement (Current and /or in the  community):     Discharge Needs  Concerns to be addressed:    Readmission within the last 30 days:    Current discharge risk:    Barriers to Discharge:      Arlyss RepressHarrison, Aeva Posey F, LCSW 01/02/2016, 10:33 AM

## 2016-01-02 NOTE — Plan of Care (Signed)
Problem: Safety: Goal: Ability to remain free from injury will improve Outcome: Completed/Met Date Met: 01/02/16 Discussed safety plan with pt although HOH.  Son was at bedside. Bed alarm set

## 2016-01-02 NOTE — H&P (Signed)
History and Physical    Olivia Nixon ZOX:[id number] DOB: 12-01-1917 DOA: 01/01/2016  PCP: Ginette Otto, MD  Patient coming from: Assisted living facility.  Chief Complaint: Rectal bleeding.  History obtained from ER physician as patient has difficulty hearing. ER physician had discussed with patient's son.  HPI: Olivia Nixon is a 82 y.o. female with CAD status post CABG, hypertension, post radioactive iodine hypothyroidism presents to the ER because patient had large bloody bowel movement at her living facility. Patient also had another one in the ER which was mixed with stools. Patient is hemodynamically stable. Patient is a poor historian. ER physician on exam felt the patient had mild lower quadrant tenderness for which CT abdomen and pelvis has been ordered. Patient has history of CAD for which patient is on aspirin. Patient being admitted for rectal bleeding. Patient's son has stated that  this is thefirst episode of rectal bleeding for the patient.   ED Course: Stool for occult blood was positive.  Review of Systems: As per HPI, rest all negative.   Past Medical History:  Diagnosis Date  . Anemia, mild    Hemoglobin of 11.5-12  . ASCVD (arteriosclerotic cardiovascular disease)    CABG surgery in 5/00  . Dementia with behavioral disturbance 10/18/2013  . Diverticular disease   . DJD (degenerative joint disease)    Left knee  . Hearing loss   . HTN (hypertension) 10/18/2013  . Hyperlipidemia   . Hypertension    Unspecified  . Hypothyroid    Initially presented with toxic goiter; levothyroxine Rx needed after RAI in 2008    Past Surgical History:  Procedure Laterality Date  . CATARACT EXTRACTION     with lens implantation  . CORONARY ARTERY BYPASS GRAFT  08/1998  . MELANOMA EXCISION     from right arm  . TONSILLECTOMY AND ADENOIDECTOMY    . TOTAL ABDOMINAL HYSTERECTOMY       reports that she has never smoked. She has never used smokeless tobacco. She  reports that she does not drink alcohol. Her drug history is not on file.  Allergies  Allergen Reactions  . Amoxicillin     PER MAR  . Bisacodyl     PER MAR  . Celebrex [Celecoxib]     PER MAR  . Chlorthalidone     PER MAR  . Ciprofloxacin     PER MAR  . Codeine     PER MAR  . Darvocet [Propoxyphene N-Acetaminophen]     PER MAR  . Diclofenac Sodium     PER MAR  . Flagyl [Metronidazole Hcl]     PER MAR  . Ibuprofen     PER MAR  . Iodine     PER MAR  . Meloxicam     PER MAR  . Methocarbamol     PER MAR  . Oruvail [Ketoprofen]     PER MAR  . Ropinirole Hcl     PER MAR  . Sulfa Antibiotics     PER MAR  . Valium     PER MAR  . Zolpidem Tartrate     PER MAR    Family History  Problem Relation Age of Onset  . Hypertension Other     Prior to Admission medications   Medication Sig Start Date End Date Taking? Authorizing Provider  acetaminophen (TYLENOL) 325 MG tablet Take 650 mg by mouth 3 (three) times daily.   Yes Historical Provider, MD  aspirin 81 MG chewable tablet Chew 81  mg by mouth daily.   Yes Historical Provider, MD  cycloSPORINE (RESTASIS) 0.05 % ophthalmic emulsion Place 1 drop into both eyes 2 (two) times daily.   Yes Historical Provider, MD  diclofenac sodium (VOLTAREN) 1 % GEL Apply 2 g topically 3 (three) times daily. Applied to right knee   Yes Historical Provider, MD  levocetirizine (XYZAL) 5 MG tablet Take 5 mg by mouth daily as needed for allergies.    Yes Historical Provider, MD  levothyroxine (SYNTHROID, LEVOTHROID) 100 MCG tablet Take 100 mcg by mouth daily before breakfast.   Yes Historical Provider, MD  losartan (COZAAR) 50 MG tablet Take 50 mg by mouth daily.   Yes Historical Provider, MD  metoprolol succinate (TOPROL-XL) 100 MG 24 hr tablet Take 100 mg by mouth daily. Take with or immediately following a meal.   Yes Historical Provider, MD  polyethylene glycol (MIRALAX / GLYCOLAX) packet Take 17 g by mouth daily.   Yes Historical Provider, MD   traMADol (ULTRAM) 50 MG tablet Take 50 mg by mouth daily.   Yes Historical Provider, MD  feeding supplement, ENSURE COMPLETE, (ENSURE COMPLETE) LIQD Take 237 mLs by mouth 3 (three) times daily between meals. Patient not taking: Reported on 01/01/2016 10/23/13   Rodolph Bonganiel V Thompson, MD  levothyroxine (SYNTHROID, LEVOTHROID) 75 MCG tablet Take 1 tablet (75 mcg total) by mouth daily. Patient not taking: Reported on 01/01/2016 10/23/13   Rodolph Bonganiel V Thompson, MD  LORazepam (ATIVAN) 0.5 MG tablet Take 1 tablet (0.5 mg total) by mouth every 6 (six) hours as needed (agitation). Patient not taking: Reported on 01/01/2016 10/23/13   Rodolph Bonganiel V Thompson, MD  nitrofurantoin, macrocrystal-monohydrate, (MACROBID) 100 MG capsule Take 1 capsule (100 mg total) by mouth every 12 (twelve) hours. Take for 6 days then stop. Patient not taking: Reported on 01/01/2016 10/23/13   Rodolph Bonganiel V Thompson, MD    Physical Exam: Vitals:   01/01/16 2335 01/02/16 0030 01/02/16 0100 01/02/16 0225  BP: 165/99 118/63 153/76 (!) 144/57  Pulse: 74 75 70 69  Resp: 25 19 15    Temp: 98 F (36.7 C)   97.9 F (36.6 C)  TempSrc: Oral   Oral  SpO2: 97% 97% 98% 97%  Weight:    107 lb 12.9 oz (48.9 kg)  Height:    5\' 3"  (1.6 m)      Constitutional: Moderately built and nourished. Vitals:   01/01/16 2335 01/02/16 0030 01/02/16 0100 01/02/16 0225  BP: 165/99 118/63 153/76 (!) 144/57  Pulse: 74 75 70 69  Resp: 25 19 15    Temp: 98 F (36.7 C)   97.9 F (36.6 C)  TempSrc: Oral   Oral  SpO2: 97% 97% 98% 97%  Weight:    107 lb 12.9 oz (48.9 kg)  Height:    5\' 3"  (1.6 m)   Eyes: Anicteric mild pallor. ENMT:  No discharge from the ears eyes nose or mouth. Neck:  No mass felt. No JVD appreciated. Respiratory:  No rhonchi or crepitations. Cardiovascular:  S1 and S2 heard. Abdomen:  Nontender bowel sounds present. No guarding or rigidity. Musculoskeletal:  No edema. Skin:  No rash. Appears warm. Neurologic: no facial asymmetry moves all  extremities. Hard of hearing. Psychiatric:  Alert awake oriented to her name.   Labs on Admission: I have personally reviewed following labs and imaging studies  CBC:  Recent Labs Lab 01/01/16 2220  WBC 12.2*  HGB 11.3*  HCT 34.4*  MCV 88.9  PLT 303   Basic Metabolic Panel:  Recent Labs Lab 01/01/16 2220  NA 134*  K 4.2  CL 104  CO2 23  GLUCOSE 108*  BUN 14  CREATININE 0.56  CALCIUM 8.5*   GFR: Estimated Creatinine Clearance: 32.5 mL/min (by C-G formula based on SCr of 0.56 mg/dL). Liver Function Tests:  Recent Labs Lab 01/01/16 2220  AST 13*  ALT 7*  ALKPHOS 83  BILITOT 0.3  PROT 6.6  ALBUMIN 3.4*   No results for input(s): LIPASE, AMYLASE in the last 168 hours. No results for input(s): AMMONIA in the last 168 hours. Coagulation Profile:  Recent Labs Lab 01/01/16 2220  INR 0.95   Cardiac Enzymes: No results for input(s): CKTOTAL, CKMB, CKMBINDEX, TROPONINI in the last 168 hours. BNP (last 3 results) No results for input(s): PROBNP in the last 8760 hours. HbA1C: No results for input(s): HGBA1C in the last 72 hours. CBG: No results for input(s): GLUCAP in the last 168 hours. Lipid Profile: No results for input(s): CHOL, HDL, LDLCALC, TRIG, CHOLHDL, LDLDIRECT in the last 72 hours. Thyroid Function Tests: No results for input(s): TSH, T4TOTAL, FREET4, T3FREE, THYROIDAB in the last 72 hours. Anemia Panel: No results for input(s): VITAMINB12, FOLATE, FERRITIN, TIBC, IRON, RETICCTPCT in the last 72 hours. Urine analysis:    Component Value Date/Time   COLORURINE YELLOW 10/18/2013 0347   APPEARANCEUR CLOUDY (A) 10/18/2013 0347   LABSPEC 1.017 10/18/2013 0347   PHURINE 6.5 10/18/2013 0347   GLUCOSEU NEGATIVE 10/18/2013 0347   HGBUR SMALL (A) 10/18/2013 0347   BILIRUBINUR NEGATIVE 10/18/2013 0347   KETONESUR NEGATIVE 10/18/2013 0347   PROTEINUR NEGATIVE 10/18/2013 0347   UROBILINOGEN 0.2 10/18/2013 0347   NITRITE NEGATIVE 10/18/2013 0347    LEUKOCYTESUR MODERATE (A) 10/18/2013 0347   Sepsis Labs: @LABRCNTIP (procalcitonin:4,lacticidven:4) )No results found for this or any previous visit (from the past 240 hour(s)).   Radiological Exams on Admission: Dg Chest Portable 1 View  Result Date: 01/02/2016 CLINICAL DATA:  Acute onset of generalized abdominal pain and hematochezia. Initial encounter. EXAM: PORTABLE CHEST 1 VIEW COMPARISON:  Chest radiograph performed 10/22/2013 FINDINGS: The lungs are well-aerated. Mild left basilar atelectasis is noted. There is no evidence of pleural effusion or pneumothorax. The cardiomediastinal silhouette is mildly enlarged. The patient is status post median sternotomy, with evidence of prior CABG. No acute osseous abnormalities are seen. IMPRESSION: Mild left basilar atelectasis noted.  Mild cardiomegaly. Electronically Signed   By: Roanna Raider M.D.   On: 01/02/2016 00:05    Assessment/Plan Principal Problem:   Rectal bleeding Active Problems:   HYPOTHYROIDISM, POST-RADIOACTIVE IODINE   Normochromic normocytic anemia   Hearing loss   HTN (hypertension)   1. Rectal bleeding - since patient has frank rectal bleeding could be diverticular in origin. Patient is hemodynamically stable. Will hold off aspirin and closely follow CBC. CT abdomen and pelvis pending for abdominal tenderness. 2. CAD status post CABG - holding aspirin due to bleeding. Continue metoprolol. 3. Hypertension on Cozaar and metoprolol. 4. Normocytic normochromic anemia - follow CBC. 5. Hard of hearing. 6. History of hypothyroidism post radioactive iodine treatment.   DVT prophylaxis: SCDs. Code Status: Full code.  Family Communication: ER physician and discuss with son.  Disposition Plan: Assisted living facility.  Consults called: None.  Admission status: Observation.    Eduard Clos MD [facility] Pager [phone].  If 7PM-7AM, please contact night-coverage [email or web address] Password  [email or web address]  01/02/2016, 3:24 AM

## 2016-01-02 NOTE — Plan of Care (Signed)
Problem: Pain Managment: Goal: General experience of comfort will improve Outcome: Completed/Met Date Met: 01/02/16 Pt denies pain

## 2016-01-02 NOTE — ED Notes (Signed)
Pt. Son [name] telephone number [phone].

## 2016-01-02 NOTE — Care Management Note (Signed)
Case Management Note  Patient Details  Name: Coralie Carpenllen Y Dillavou MRN: [id number] Date of Birth: 06/23/1917  Subjective/Objective:  82 y/o f admitted w/Rectal bleed. From SNF-[facility](LTC) CSW already following.                  Action/Plan:d/c plan SNF.   Expected Discharge Date:                  Expected Discharge Plan:  Skilled Nursing Facility  In-House Referral:  Clinical Social Work  Discharge planning Services  CM Consult  Post Acute Care Choice:    Choice offered to:     DME Arranged:    DME Agency:     HH Arranged:    HH Agency:     Status of Service:  In process, will continue to follow  If discussed at Long Length of Stay Meetings, dates discussed:    Additional Comments:  Lanier ClamMahabir, Carlisha Wisler, RN 01/02/2016, 12:58 PM

## 2016-01-03 ENCOUNTER — Inpatient Hospital Stay (HOSPITAL_COMMUNITY): Payer: Medicare Other

## 2016-01-03 DIAGNOSIS — K921 Melena: Secondary | ICD-10-CM

## 2016-01-03 DIAGNOSIS — D649 Anemia, unspecified: Secondary | ICD-10-CM

## 2016-01-03 DIAGNOSIS — K625 Hemorrhage of anus and rectum: Secondary | ICD-10-CM

## 2016-01-03 DIAGNOSIS — R1310 Dysphagia, unspecified: Secondary | ICD-10-CM

## 2016-01-03 LAB — CBC
HEMATOCRIT: 21.1 % — AB (ref 36.0–46.0)
HEMOGLOBIN: 7.2 g/dL — AB (ref 12.0–15.0)
MCH: 29.4 pg (ref 26.0–34.0)
MCHC: 34.1 g/dL (ref 30.0–36.0)
MCV: 86.1 fL (ref 78.0–100.0)
PLATELETS: 262 10*3/uL (ref 150–400)
RBC: 2.45 MIL/uL — ABNORMAL LOW (ref 3.87–5.11)
RDW: 14.4 % (ref 11.5–15.5)
WBC: 9.9 10*3/uL (ref 4.0–10.5)

## 2016-01-03 LAB — BASIC METABOLIC PANEL WITH GFR
Anion gap: 4 — ABNORMAL LOW (ref 5–15)
BUN: 17 mg/dL (ref 6–20)
CO2: 27 mmol/L (ref 22–32)
Calcium: 8.4 mg/dL — ABNORMAL LOW (ref 8.9–10.3)
Chloride: 109 mmol/L (ref 101–111)
Creatinine, Ser: 0.73 mg/dL (ref 0.44–1.00)
GFR calc Af Amer: >60 mL/min
GFR calc non Af Amer: >60 mL/min
Glucose, Bld: 92 mg/dL (ref 65–99)
Potassium: 3.9 mmol/L (ref 3.5–5.1)
Sodium: 140 mmol/L (ref 135–145)

## 2016-01-03 LAB — RETICULOCYTES
RBC.: 2.45 MIL/uL — ABNORMAL LOW (ref 3.87–5.11)
RETIC COUNT ABSOLUTE: 39.2 10*3/uL (ref 19.0–186.0)
Retic Ct Pct: 1.6 % (ref 0.4–3.1)

## 2016-01-03 LAB — FERRITIN: FERRITIN: 34 ng/mL (ref 11–307)

## 2016-01-03 LAB — PREPARE RBC (CROSSMATCH)

## 2016-01-03 LAB — IRON AND TIBC
IRON: 44 ug/dL (ref 28–170)
Saturation Ratios: 16 % (ref 10.4–31.8)
TIBC: 267 ug/dL (ref 250–450)
UIBC: 223 ug/dL

## 2016-01-03 LAB — GLUCOSE, CAPILLARY
GLUCOSE-CAPILLARY: 99 mg/dL (ref 65–99)
GLUCOSE-CAPILLARY: 93 mg/dL (ref 65–99)
Glucose-Capillary: 86 mg/dL (ref 65–99)

## 2016-01-03 LAB — FOLATE: FOLATE: 19.9 ng/mL (ref >5.9)

## 2016-01-03 LAB — VITAMIN B12: Vitamin B-12: 393 pg/mL (ref 180–914)

## 2016-01-03 MED ORDER — TECHNETIUM TC 99M-LABELED RED BLOOD CELLS IV KIT
25.0000 | PACK | Freq: Once | INTRAVENOUS | Status: AC | PRN
  Administered 2016-01-03: 25 via INTRAVENOUS

## 2016-01-03 MED ORDER — SODIUM CHLORIDE 0.9 % IV SOLN
Freq: Once | INTRAVENOUS | Status: AC
  Administered 2016-01-03: 13:00:00 via INTRAVENOUS

## 2016-01-03 NOTE — Consult Note (Signed)
Referring Provider: Dr. Izola Price Primary Care Physician:  Ginette Otto, MD Primary Gastroenterologist:  Dr. Juanda Chance  Reason for Consultation:  GI bleed  HPI: Olivia Nixon is a 82 y.o. female with CAD status post CABG, hypertension, post radioactive iodine hypothyroidism who presented to the ER because she had a large bloody bowel movement at her living facility. Patient also had another one in the ER which was mixed with stools. Patient has been hemodynamically stable. Patient is a poor historian. ER physician on exam felt the patient had mild lower quadrant tenderness for which CT abdomen and pelvis with contrast was ordered and showed extensive sigmoid diverticulosis. Patient has history of CAD for which patient is on aspirin but no other blood-thinners or anti-platelet agents.  Patient's son has stated that this is the first episode of rectal bleeding for the patient but that she's had issues with anemia and "low blood" for a long time.  Had been seen by Dr. Kendell Bane in [place] many years ago and then they also report that they saw Dr. Juanda Chance in our office more recently although that was also several years ago.  According to nursing, she had another episode of bleeding overnight and then one this morning that is described as red blood mixed with stool.  Hgb down to 7.2 grams this AM so she is going to receive 2 units PRBC's.  She also complains of trouble swallowing intermittently for quite some time where it feels like stuff gets hung up in her esophagus.  Her son tells me that this has been occurring for years.  Sometimes then stuff comes back up.  She vomited her coffee and breakfast this AM.   Past Medical History:  Diagnosis Date  . Anemia, mild    Hemoglobin of 11.5-12  . ASCVD (arteriosclerotic cardiovascular disease)    CABG surgery in 5/00  . Dementia with behavioral disturbance 10/18/2013  . Diverticular disease   . DJD (degenerative joint disease)    Left knee  . Hearing  loss   . HTN (hypertension) 10/18/2013  . Hyperlipidemia   . Hypertension    Unspecified  . Hypothyroid    Initially presented with toxic goiter; levothyroxine Rx needed after RAI in 2008    Past Surgical History:  Procedure Laterality Date  . CATARACT EXTRACTION     with lens implantation  . CORONARY ARTERY BYPASS GRAFT  08/1998  . MELANOMA EXCISION     from right arm  . TONSILLECTOMY AND ADENOIDECTOMY    . TOTAL ABDOMINAL HYSTERECTOMY      Prior to Admission medications   Medication Sig Start Date End Date Taking? Authorizing Provider  acetaminophen (TYLENOL) 325 MG tablet Take 650 mg by mouth 3 (three) times daily.   Yes Historical Provider, MD  aspirin 81 MG chewable tablet Chew 81 mg by mouth daily.   Yes Historical Provider, MD  cycloSPORINE (RESTASIS) 0.05 % ophthalmic emulsion Place 1 drop into both eyes 2 (two) times daily.   Yes Historical Provider, MD  diclofenac sodium (VOLTAREN) 1 % GEL Apply 2 g topically 3 (three) times daily. Applied to right knee   Yes Historical Provider, MD  levocetirizine (XYZAL) 5 MG tablet Take 5 mg by mouth daily as needed for allergies.    Yes Historical Provider, MD  levothyroxine (SYNTHROID, LEVOTHROID) 100 MCG tablet Take 100 mcg by mouth daily before breakfast.   Yes Historical Provider, MD  losartan (COZAAR) 50 MG tablet Take 50 mg by mouth daily.   Yes Historical  Provider, MD  metoprolol succinate (TOPROL-XL) 100 MG 24 hr tablet Take 100 mg by mouth daily. Take with or immediately following a meal.   Yes Historical Provider, MD  polyethylene glycol (MIRALAX / GLYCOLAX) packet Take 17 g by mouth daily.   Yes Historical Provider, MD  traMADol (ULTRAM) 50 MG tablet Take 50 mg by mouth daily.   Yes Historical Provider, MD  feeding supplement, ENSURE COMPLETE, (ENSURE COMPLETE) LIQD Take 237 mLs by mouth 3 (three) times daily between meals. Patient not taking: Reported on 01/01/2016 10/23/13   Rodolph Bonganiel V Thompson, MD  levothyroxine (SYNTHROID,  LEVOTHROID) 75 MCG tablet Take 1 tablet (75 mcg total) by mouth daily. Patient not taking: Reported on 01/01/2016 10/23/13   Rodolph Bonganiel V Thompson, MD  LORazepam (ATIVAN) 0.5 MG tablet Take 1 tablet (0.5 mg total) by mouth every 6 (six) hours as needed (agitation). Patient not taking: Reported on 01/01/2016 10/23/13   Rodolph Bonganiel V Thompson, MD  nitrofurantoin, macrocrystal-monohydrate, (MACROBID) 100 MG capsule Take 1 capsule (100 mg total) by mouth every 12 (twelve) hours. Take for 6 days then stop. Patient not taking: Reported on 01/01/2016 10/23/13   Rodolph Bonganiel V Thompson, MD    Current Facility-Administered Medications  Medication Dose Route Frequency Provider Last Rate Last Dose  . 0.9 %  sodium chloride infusion   Intravenous Once Dorothea OgleIskra M Myers, MD      . acetaminophen (TYLENOL) tablet 650 mg  650 mg Oral Q6H PRN Eduard ClosArshad N Kakrakandy, MD       Or  . acetaminophen (TYLENOL) suppository 650 mg  650 mg Rectal Q6H PRN Eduard ClosArshad N Kakrakandy, MD      . cycloSPORINE (RESTASIS) 0.05 % ophthalmic emulsion 1 drop  1 drop Both Eyes BID Eduard ClosArshad N Kakrakandy, MD   1 drop at 01/02/16 2226  . levothyroxine (SYNTHROID, LEVOTHROID) tablet 100 mcg  100 mcg Oral QAC breakfast Eduard ClosArshad N Kakrakandy, MD   100 mcg at 01/02/16 0800  . loratadine (CLARITIN) tablet 10 mg  10 mg Oral Daily PRN Eduard ClosArshad N Kakrakandy, MD      . losartan (COZAAR) tablet 50 mg  50 mg Oral Daily Eduard ClosArshad N Kakrakandy, MD   50 mg at 01/02/16 0949  . metoprolol succinate (TOPROL-XL) 24 hr tablet 100 mg  100 mg Oral Daily Eduard ClosArshad N Kakrakandy, MD   100 mg at 01/02/16 0948  . ondansetron (ZOFRAN) tablet 4 mg  4 mg Oral Q6H PRN Eduard ClosArshad N Kakrakandy, MD       Or  . ondansetron [facility](ZOFRAN) injection 4 mg  4 mg Intravenous Q6H PRN Eduard ClosArshad N Kakrakandy, MD   4 mg at 01/03/16 0916    Allergies as of 01/01/2016 - Review Complete 01/01/2016  Allergen Reaction Noted  . Amoxicillin  12/22/2010  . Bisacodyl  12/22/2010  . Celebrex [celecoxib]  12/22/2010  . Chlorthalidone   12/22/2010  . Ciprofloxacin  12/22/2010  . Codeine  12/22/2010  . Darvocet [propoxyphene n-acetaminophen]  12/22/2010  . Diclofenac sodium  12/22/2010  . Flagyl [metronidazole hcl]  12/22/2010  . Ibuprofen  12/22/2010  . Iodine  12/22/2010  . Meloxicam  12/22/2010  . Methocarbamol  12/22/2010  . Oruvail [ketoprofen]  12/22/2010  . Ropinirole hcl  12/22/2010  . Sulfa antibiotics  12/22/2010  . Valium  12/22/2010  . Zolpidem tartrate  12/22/2010    Family History  Problem Relation Age of Onset  . Hypertension Other     Social History   Social History  . Marital status: Widowed  Spouse name: N/A  . Number of children: N/A  . Years of education: N/A   Occupational History  . Retired    Social History Main Topics  . Smoking status: Never Smoker  . Smokeless tobacco: Never Used  . Alcohol use No  . Drug use: Unknown  . Sexual activity: Not on file   Other Topics Concern  . Not on file   Social History Narrative  . No narrative on file    Review of Systems: Ten point ROS is O/W negative except as mentioned in HPI.  Physical Exam: Vital signs in last 24 hours: Temp:  [97.9 F (36.6 C)-98.6 F (37 C)] 97.9 F (36.6 C) (09/29 0703) Pulse Rate:  [62-92] 62 (09/29 0703) Resp:  [18-20] 18 (09/29 0703) BP: (134-144)/(62-80) 136/62 (09/29 0703) SpO2:  [95 %-99 %] 96 % (09/29 0703) Last BM Date: 01/03/16 General:  Alert, Well-developed, well-nourished, pleasant and cooperative in NAD Head:  Normocephalic and atraumatic. Eyes:  Sclera clear, no icterus.  Conjunctiva pink. Ears:  HOH. Mouth:  No deformity or lesions.   Lungs:  Clear throughout to auscultation.  No wheezes, crackles, or rhonchi.  Heart:  Regular rate and rhythm; no murmurs, clicks, rubs, or gallops. Abdomen:  Soft, non-distended.  BS present.  Non-tender. Rectal:  Deferred  Msk:  Symmetrical without gross deformities. Pulses:  Normal pulses noted. Extremities:  Without clubbing or  edema. Neurologic:  Alert and oriented x 4;  grossly normal neurologically. Skin:  Intact without significant lesions or rashes. Psych:  Alert and cooperative. Normal mood and affect.  Intake/Output from previous day: 09/28 0701 - 09/29 0700 In: 1400 [P.O.:600; I.V.:800] Out: -  Intake/Output this shift: No intake/output data recorded.  Lab Results:  Recent Labs  01/02/16 0528 01/02/16 0818 01/03/16 0544  WBC 14.8* 11.1* 9.9  HGB 9.6* 9.4* 7.2*  HCT 29.2* 27.8* 21.1*  PLT 314 291 262   BMET  Recent Labs  01/02/16 0528 01/02/16 1236 01/03/16 0544  NA 133* 133* 140  K 4.3 4.4 3.9  CL 100* 100* 109  CO2 24 24 27   GLUCOSE 132* 178* 92  BUN 17 17 17   CREATININE 0.61 0.69 0.73  CALCIUM 8.8* 8.6* 8.4*   LFT  Recent Labs  01/02/16 0528  PROT 6.4*  ALBUMIN 3.4*  AST 15  ALT 8*  ALKPHOS 89  BILITOT 0.2*   PT/INR  Recent Labs  01/01/16 2220  LABPROT 12.7  INR 0.95   Studies/Results: Ct Abdomen Pelvis W Contrast  Result Date: 01/02/2016 CLINICAL DATA:  82 year old female with bloody stools. History of diverticulosis. EXAM: CT ABDOMEN AND PELVIS WITH CONTRAST TECHNIQUE: Multidetector CT imaging of the abdomen and pelvis was performed using the standard protocol following bolus administration of intravenous contrast. CONTRAST:  80mL ISOVUE-300 IOPAMIDOL (ISOVUE-300) INJECTION 61%, 15mL ISOVUE-300 IOPAMIDOL (ISOVUE-300) INJECTION 61% COMPARISON:  Abdominal radiograph dated 10/20/2013 and CT dated 04/20/2003 FINDINGS: Evaluation of this exam is limited due to respiratory motion artifact. Lower chest: Left lung base linear atelectasis/ scarring. There is advanced coronary vascular calcification and CABG vascular clips. No intra-abdominal free air or free fluid. Hepatobiliary: Multiple small stones within the gallbladder. No pericholecystic fluid. There is mild intrahepatic biliary ductal dilatation. The liver appears unremarkable. Pancreas: There is a 2.1 x 1.8 cm  ill-defined hypodense area involving the head of the pancreas corresponding to the cystic lesions noted in this region on the CT dated 04/20/2003. There is no associated pancreatic duct dilatation. No peripancreatic stranding. Spleen: Normal in  size without focal abnormality. Adrenals/Urinary Tract: The adrenal glands appear unremarkable. Mild bilateral renal atrophy with mild fullness of the renal collecting systems bilaterally. The ureters appear unremarkable. The urinary bladder is distended. Stomach/Bowel: There is a small hiatal hernia with gastroesophageal reflux. No evidence of bowel obstruction or active inflammation. There is extensive sigmoid and descending colonic diverticulosis without active inflammatory changes. The appendix is not visualized with certainty. No inflammatory changes identified in the right lower quadrant. Vascular/Lymphatic: There is extensive aortoiliac atherosclerotic disease. The aorta is torturous. There is a 2.2 cm infrarenal aortic ectasia. There is atherosclerotic calcification of the origins of the celiac axis, SMA, IMA. The origins of these vessels however remain patent. No portal venous gas identified. There is no adenopathy. Reproductive: Hysterectomy. Other: None Musculoskeletal: Osteopenia with scoliosis and degenerative changes of the spine. No acute fracture. Median sternotomy wires noted. IMPRESSION: Extensive sigmoid diverticulosis without active inflammatory changes. No evidence of bowel obstruction. Gallstones without evidence of acute cholecystitis. Ill-defined hypodensity in the head of the pancreas corresponding to the cystic lesions seen on the prior CT with no significant interval change in size. No dilatation of the main pancreatic duct. Electronically Signed   By: Elgie Collard M.D.   On: 01/02/2016 06:04   Dg Chest Portable 1 View  Result Date: 01/02/2016 CLINICAL DATA:  Acute onset of generalized abdominal pain and hematochezia. Initial encounter.  EXAM: PORTABLE CHEST 1 VIEW COMPARISON:  Chest radiograph performed 10/22/2013 FINDINGS: The lungs are well-aerated. Mild left basilar atelectasis is noted. There is no evidence of pleural effusion or pneumothorax. The cardiomediastinal silhouette is mildly enlarged. The patient is status post median sternotomy, with evidence of prior CABG. No acute osseous abnormalities are seen. IMPRESSION: Mild left basilar atelectasis noted.  Mild cardiomegaly. Electronically Signed   By: Roanna Raider M.D.   On: 01/02/2016 00:05    IMPRESSION:  -GIB:  Likely lower and presumably diverticular in origin as she has extensive diverticular disease on CT scan.  She's had colonoscopy though it has been many years.  Patient expressed that she really does not want to be put to sleep. -Acute on chronic anemia:  Acutely secondary to GI losses.  Hgb down this AM so will be transfuse with 2 units PRBC's. -Dysphagia:  Intermittent for quite some time.  With associated vomiting/regurgitation of food and beverage contents.  Likely has motility issue such as presbyesophagus.  PLAN: -Observe for now.  Will defer colonoscopy at this point.  If bleeding increase again and becomes brisk then would recommend nuc med tagged bleeding scan with IR for angio if positive. -Monitor Hgb and transfuse further prn. -Will check esophagram.   Kosisochukwu Goldberg D.  01/03/2016, 11:43 AM  Pager number [phone]

## 2016-01-03 NOTE — Progress Notes (Signed)
Initial Nutrition Assessment  DOCUMENTATION CODES:   Severe malnutrition in context of chronic illness  INTERVENTION:  Advance diet per MD as medically feasible.   RD will continue to monitor for intake and need for intervention when diet advanced.   NUTRITION DIAGNOSIS:   Malnutrition (Severe) related to chronic illness as evidenced by severe depletion of muscle mass, severe depletion of body fat.  GOAL:   Patient will meet greater than or equal to 90% of their needs  MONITOR:   Diet advancement, PO intake, Labs, Weight trends, I & O's  REASON FOR ASSESSMENT:   Malnutrition Screening Tool    ASSESSMENT:   82 y.o. female with CAD status post CABG, hypertension, post radioactive iodine hypothyroidism presented to the ER for evaluation of BRBPR at her living facility. Pt is hard of hearing and difficult to obtain hx from but she says she has had similar events in the past. She also reported sensation of food getting stuck.   Patient is very hard of hearing. Even when she did hear the questions asked, she did not always answer appropriately. She does endorse she is having trouble swallowing. Also reports she has not been eating as well at the facility she lives at compared to when she was at home. No family present.   No recent weight history in chart. Patient was 113-115 lbs in 2015 and >120 lbs prior to 2012.   Meal Completion: 40% breakfast and 25% of lunch on 9/28 per chart. Pt is NPO now.   Medications reviewed and include: zofran prn.   Labs reviewed: CBG 86-99.  Nutrition-Focused physical exam completed. Findings are severe fat depletion, severe muscle depletion, and no edema. Patient meets criteria for severe chronic malnutrition.   Discussed plan with RN. Pending recommendations from GI team regarding GI bleed and dysphagia.   Diet Order:  Diet NPO time specified  Skin:  Reviewed, no issues  Last BM:  01/03/2016  Height:   Ht Readings from Last 1 Encounters:   01/02/16 5\' 3"  (1.6 m)    Weight:   Wt Readings from Last 1 Encounters:  01/02/16 107 lb 12.9 oz (48.9 kg)    Ideal Body Weight:  52.27 kg  BMI:  Body mass index is 19.1 kg/m.  Estimated Nutritional Needs:   Kcal:  1100-1200  Protein:  50-60 grams  Fluid:  1.1-1.2 L/day  EDUCATION NEEDS:   No education needs identified at this time  Helane RimaLeanne Delfin Squillace, MS, RD, LDN Pager: [phone] After Hours Pager: [phone]

## 2016-01-03 NOTE — Progress Notes (Signed)
Patient ID: Olivia Nixon, female   DOB: 03/27/1918, 82 y.o.   MRN: [id number]    PROGRESS NOTE    Olivia Carpenllen Y Yepes  [id number] DOB: 03/08/1918 DOA: 01/01/2016  PCP: [facility] OttoSTONEKING,HAL THOMAS, MD   Brief Narrative:  82 y.o. female with CAD status post CABG, hypertension, post radioactive iodine hypothyroidism presented to the ER for evaluation of BRBPR at her living facility. Pt is hard of hearing and difficult to obtain hx from but she says she has had similar events in the past. She also reported sensation of food getting stuck.   Major events since admission: 9/29 - maroon colored stool, Hg drom from 9 --> 7, RN also reported vomiting even after cup of coffee   Assessment & Plan:   Principal Problem:   Acute blood loss anemia due to GI bleed - Hg drop since admission: 14.8 --> 9.6 --> 9.4 --> 7.2 this AM - will transfuse two U pRBC and will ask RN to place appropriate post transfusion H/H - also keep NPO for now, consult with GI team on call, assistance appreciated  - CBC in AM  Active Problems:   Dysphagia - pt reports this has been present before - will ask GI team to provide recommendations     HYPOTHYROIDISM, POST-RADIOACTIVE IODINE - continue synthroid     HTN (hypertension), essential - continue Losartan and Metoprolol    DVT prophylaxis: SCD's Code Status:  Family Communication: Patient at bedside, pt asked me not to update her son, will respect wishes  Disposition Plan: SNF once bleeding issues resolved   Consultants:   GI  Procedures:   None  Antimicrobials:   None   Subjective: Had one bloody BM and vomited once this AM.   Objective: Vitals:   01/02/16 0944 01/02/16 1300 01/02/16 2217 01/03/16 0703  BP: 124/68 (!) 144/72 134/80 136/62  Pulse:  92 86 62  Resp:  20 20 18   Temp:  98.6 F (37 C) 98.6 F (37 C) 97.9 F (36.6 C)  TempSrc:  Oral Oral Oral  SpO2: 100% 99% 95% 96%  Weight:      Height:        Intake/Output Summary (Last 24 hours)  at 01/03/16 1209 Last data filed at 01/02/16 2300  Gross per 24 hour  Intake             1040 ml  Output                0 ml  Net             1040 ml   Filed Weights   01/02/16 0225  Weight: 48.9 kg (107 lb 12.9 oz)    Examination:  General exam: Appears calm and comfortable, very HOH Respiratory system: Respiratory effort normal. Cardiovascular system: S1 & S2 heard, RRR. No JVD, rubs, gallops or clicks. No pedal edema. Gastrointestinal system: Abdomen is nondistended, soft and nontender. No organomegaly or masses felt.  Central nervous system: Alert and oriented. No focal neurological deficits.  Data Reviewed: I have personally reviewed following labs and imaging studies  CBC:  Recent Labs Lab 01/01/16 2220 01/02/16 0528 01/02/16 0818 01/03/16 0544  WBC 12.2* 14.8* 11.1* 9.9  HGB 11.3* 9.6* 9.4* 7.2*  HCT 34.4* 29.2* 27.8* 21.1*  MCV 88.9 86.4 88.0 86.1  PLT 303 314 291 262   Basic Metabolic Panel:  Recent Labs Lab 01/01/16 2220 01/02/16 0528 01/02/16 1236 01/03/16 0544  NA 134* 133* 133* 140  K 4.2 4.3  4.4 3.9  CL 104 100* 100* 109  CO2 23 24 24 27   GLUCOSE 108* 132* 178* 92  BUN 14 17 17 17   CREATININE 0.56 0.61 0.69 0.73  CALCIUM 8.5* 8.8* 8.6* 8.4*   Liver Function Tests:  Recent Labs Lab 01/01/16 2220 01/02/16 0528  AST 13* 15  ALT 7* 8*  ALKPHOS 83 89  BILITOT 0.3 0.2*  PROT 6.6 6.4*  ALBUMIN 3.4* 3.4*   Coagulation Profile:  Recent Labs Lab 01/01/16 2220  INR 0.95   CBG:  Recent Labs Lab 01/03/16 0002 01/03/16 0722  GLUCAP 93 99   Anemia Panel:  Recent Labs  01/03/16 0544  VITAMINB12 393  FOLATE 19.9  FERRITIN 34  TIBC 267  IRON 44  RETICCTPCT 1.6   Recent Results (from the past 240 hour(s))  MRSA PCR Screening     Status: None   Collection Time: 01/02/16  9:55 AM  Result Value Ref Range Status   MRSA by PCR NEGATIVE NEGATIVE Final    Comment:        The GeneXpert MRSA Assay (FDA approved for NASAL  specimens only), is one component of a comprehensive MRSA colonization surveillance program. It is not intended to diagnose MRSA infection nor to guide or monitor treatment for MRSA infections.       Radiology Studies: Ct Abdomen Pelvis W Contrast  Result Date: 01/02/2016 CLINICAL DATA:  82 year old female with bloody stools. History of diverticulosis. EXAM: CT ABDOMEN AND PELVIS WITH CONTRAST TECHNIQUE: Multidetector CT imaging of the abdomen and pelvis was performed using the standard protocol following bolus administration of intravenous contrast. CONTRAST:  80mL ISOVUE-300 IOPAMIDOL (ISOVUE-300) INJECTION 61%, 15mL ISOVUE-300 IOPAMIDOL (ISOVUE-300) INJECTION 61% COMPARISON:  Abdominal radiograph dated 10/20/2013 and CT dated 04/20/2003 FINDINGS: Evaluation of this exam is limited due to respiratory motion artifact. Lower chest: Left lung base linear atelectasis/ scarring. There is advanced coronary vascular calcification and CABG vascular clips. No intra-abdominal free air or free fluid. Hepatobiliary: Multiple small stones within the gallbladder. No pericholecystic fluid. There is mild intrahepatic biliary ductal dilatation. The liver appears unremarkable. Pancreas: There is a 2.1 x 1.8 cm ill-defined hypodense area involving the head of the pancreas corresponding to the cystic lesions noted in this region on the CT dated 04/20/2003. There is no associated pancreatic duct dilatation. No peripancreatic stranding. Spleen: Normal in size without focal abnormality. Adrenals/Urinary Tract: The adrenal glands appear unremarkable. Mild bilateral renal atrophy with mild fullness of the renal collecting systems bilaterally. The ureters appear unremarkable. The urinary bladder is distended. Stomach/Bowel: There is a small hiatal hernia with gastroesophageal reflux. No evidence of bowel obstruction or active inflammation. There is extensive sigmoid and descending colonic diverticulosis without active  inflammatory changes. The appendix is not visualized with certainty. No inflammatory changes identified in the right lower quadrant. Vascular/Lymphatic: There is extensive aortoiliac atherosclerotic disease. The aorta is torturous. There is a 2.2 cm infrarenal aortic ectasia. There is atherosclerotic calcification of the origins of the celiac axis, SMA, IMA. The origins of these vessels however remain patent. No portal venous gas identified. There is no adenopathy. Reproductive: Hysterectomy. Other: None Musculoskeletal: Osteopenia with scoliosis and degenerative changes of the spine. No acute fracture. Median sternotomy wires noted. IMPRESSION: Extensive sigmoid diverticulosis without active inflammatory changes. No evidence of bowel obstruction. Gallstones without evidence of acute cholecystitis. Ill-defined hypodensity in the head of the pancreas corresponding to the cystic lesions seen on the prior CT with no significant interval change in size. No dilatation of  the main pancreatic duct. Electronically Signed   By: Elgie Collard M.D.   On: 01/02/2016 06:04   Dg Chest Portable 1 View  Result Date: 01/02/2016 CLINICAL DATA:  Acute onset of generalized abdominal pain and hematochezia. Initial encounter. EXAM: PORTABLE CHEST 1 VIEW COMPARISON:  Chest radiograph performed 10/22/2013 FINDINGS: The lungs are well-aerated. Mild left basilar atelectasis is noted. There is no evidence of pleural effusion or pneumothorax. The cardiomediastinal silhouette is mildly enlarged. The patient is status post median sternotomy, with evidence of prior CABG. No acute osseous abnormalities are seen. IMPRESSION: Mild left basilar atelectasis noted.  Mild cardiomegaly. Electronically Signed   By: Roanna Raider M.D.   On: 01/02/2016 00:05      Scheduled Meds: . sodium chloride   Intravenous Once  . cycloSPORINE  1 drop Both Eyes BID  . levothyroxine  100 mcg Oral QAC breakfast  . losartan  50 mg Oral Daily  . metoprolol  succinate  100 mg Oral Daily   Continuous Infusions:    LOS: 1 day    Time spent: 20 minutes    Debbora Presto, MD [facility] Pager [phone]  If 7PM-7AM, please contact night-coverage [email or web address] Password [facility] 01/03/2016, 12:09 PM

## 2016-01-04 ENCOUNTER — Inpatient Hospital Stay (HOSPITAL_COMMUNITY): Payer: Medicare Other

## 2016-01-04 LAB — BASIC METABOLIC PANEL
Anion gap: 5 (ref 5–15)
BUN: 12 mg/dL (ref 6–20)
CALCIUM: 8.1 mg/dL — AB (ref 8.9–10.3)
CO2: 23 mmol/L (ref 22–32)
Chloride: 109 mmol/L (ref 101–111)
Creatinine, Ser: 0.57 mg/dL (ref 0.44–1.00)
GFR calc Af Amer: >60 mL/min (ref >60)
GLUCOSE: 84 mg/dL (ref 65–99)
Potassium: 3.7 mmol/L (ref 3.5–5.1)
Sodium: 137 mmol/L (ref 135–145)

## 2016-01-04 LAB — CBC
HEMATOCRIT: 29.2 % — AB (ref 36.0–46.0)
Hemoglobin: 9.8 g/dL — ABNORMAL LOW (ref 12.0–15.0)
MCH: 28.7 pg (ref 26.0–34.0)
MCHC: 33.6 g/dL (ref 30.0–36.0)
MCV: 85.4 fL (ref 78.0–100.0)
PLATELETS: 231 10*3/uL (ref 150–400)
RBC: 3.42 MIL/uL — ABNORMAL LOW (ref 3.87–5.11)
RDW: 15 % (ref 11.5–15.5)
WBC: 8 10*3/uL (ref 4.0–10.5)

## 2016-01-04 LAB — GLUCOSE, CAPILLARY
GLUCOSE-CAPILLARY: 94 mg/dL (ref 65–99)
Glucose-Capillary: 85 mg/dL (ref 65–99)
Glucose-Capillary: 70 mg/dL (ref 65–99)

## 2016-01-04 MED ORDER — DEXTROSE 5 % IV SOLN
INTRAVENOUS | Status: DC
  Administered 2016-01-04: 20:00:00 via INTRAVENOUS

## 2016-01-04 MED ORDER — DEXTROSE-NACL 5-0.9 % IV SOLN
INTRAVENOUS | Status: DC
  Administered 2016-01-04: 21:00:00 via INTRAVENOUS

## 2016-01-04 NOTE — Progress Notes (Signed)
Patient ID: Olivia Nixon, female   DOB: [birth year], 82 y.o.   MRN: [id number]    PROGRESS NOTE    Olivia Nixon  WUJ:[id number] DOB: 06-16-1917 DOA: 01/01/2016  PCP: G[facility] Otto, MD   Brief Narrative:  82 y.o. female with CAD status post CABG, hypertension, post radioactive iodine hypothyroidism presented to the ER for evaluation of BRBPR at her living facility. Pt is hard of hearing and difficult to obtain hx from but she says she has had similar events in the past. She also reported sensation of food getting stuck.   Major events since admission: 9/29 - maroon colored stool, Hg drom from 9 --> 7, RN also reported vomiting even after cup of coffee   Assessment & Plan:   Principal Problem:   Acute blood loss anemia due to GI bleed, suspect diverticular bleed  - Hg drop since admission: 14.8 --> 9.6 --> 9.4 --> 7.2 --> 9.8, appropriate increase in Hg post transfusion  - no indication for transfusion this AM, no indication for colonoscopy at this time  - CBC in AM  Active Problems:   Dysphagia - pt reports this has been present before - esophagogram pending     HYPOTHYROIDISM, POST-RADIOACTIVE IODINE - continue synthroid     HTN (hypertension), essential - continue Losartan and Metoprolol once able to tolerate PO   DVT prophylaxis: SCD's Code Status:  Family Communication: Patient at bedside, pt asked me not to update her son, will respect wishes  Disposition Plan: SNF in 1-2 days   Consultants:   GI  Procedures:   None  Antimicrobials:   None   Subjective: Reports feeling better this AM.   Objective: Vitals:   01/03/16 2045 01/03/16 2059 01/04/16 0456 01/04/16 1300  BP: (!) 139/58 (!) 144/57 (!) 146/67 116/63  Pulse: 62 (!) 57 (!) 53 70  Resp: 18 18 18 18   Temp: 98.2 F (36.8 C) 98.2 F (36.8 C) 98.4 F (36.9 C) 98.4 F (36.9 C)  TempSrc: Oral Oral Oral Oral  SpO2: 96% 96% 100% 98%  Weight:      Height:        Intake/Output Summary (Last  24 hours) at 01/04/16 1511 Last data filed at 01/03/16 2045  Gross per 24 hour  Intake              280 ml  Output                0 ml  Net              280 ml   Filed Weights   01/02/16 0225  Weight: 48.9 kg (107 lb 12.9 oz)    Examination:  General exam: Appears calm and comfortable, very HOH Respiratory system: Respiratory effort normal. Cardiovascular system: S1 & S2 heard, RRR. No JVD, rubs, gallops or clicks. No pedal edema. Gastrointestinal system: Abdomen is nondistended, soft and nontender. No organomegaly or masses felt.  Central nervous system: Alert and oriented. No focal neurological deficits.  Data Reviewed: I have personally reviewed following labs and imaging studies  CBC:  Recent Labs Lab 01/01/16 2220 01/02/16 0528 01/02/16 0818 01/03/16 0544 01/04/16 0519  WBC 12.2* 14.8* 11.1* 9.9 8.0  HGB 11.3* 9.6* 9.4* 7.2* 9.8*  HCT 34.4* 29.2* 27.8* 21.1* 29.2*  MCV 88.9 86.4 88.0 86.1 85.4  PLT 303 314 291 262 231   Basic Metabolic Panel:  Recent Labs Lab 01/01/16 2220 01/02/16 0528 01/02/16 1236 01/03/16 0544 01/04/16 0519  NA 134*  133* 133* 140 137  K 4.2 4.3 4.4 3.9 3.7  CL 104 100* 100* 109 109  CO2 23 24 24 27 23   GLUCOSE 108* 132* 178* 92 84  BUN 14 17 17 17 12   CREATININE 0.56 0.61 0.69 0.73 0.57  CALCIUM 8.5* 8.8* 8.6* 8.4* 8.1*   Liver Function Tests:  Recent Labs Lab 01/01/16 2220 01/02/16 0528  AST 13* 15  ALT 7* 8*  ALKPHOS 83 89  BILITOT 0.3 0.2*  PROT 6.6 6.4*  ALBUMIN 3.4* 3.4*   Coagulation Profile:  Recent Labs Lab 01/01/16 2220  INR 0.95   CBG:  Recent Labs Lab 01/03/16 0002 01/03/16 0722 01/03/16 1230 01/03/16 1922 01/04/16 0523  GLUCAP 93 99 86 93 85   Anemia Panel:  Recent Labs  01/03/16 0544  VITAMINB12 393  FOLATE 19.9  FERRITIN 34  TIBC 267  IRON 44  RETICCTPCT 1.6   Recent Results (from the past 240 hour(s))  MRSA PCR Screening     Status: None   Collection Time: 01/02/16  9:55 AM    Result Value Ref Range Status   MRSA by PCR NEGATIVE NEGATIVE Final    Comment:        The GeneXpert MRSA Assay (FDA approved for NASAL specimens only), is one component of a comprehensive MRSA colonization surveillance program. It is not intended to diagnose MRSA infection nor to guide or monitor treatment for MRSA infections.       Radiology Studies: Nm Gi Blood Loss  Result Date: 01/03/2016 CLINICAL DATA:  Bloody stool earlier today EXAM: NUCLEAR MEDICINE GASTROINTESTINAL BLEEDING SCAN TECHNIQUE: Sequential abdominal images were obtained following intravenous administration of Tc-1777m labeled red blood cells. RADIOPHARMACEUTICALS:  25  mCi Tc-4577m in-vitro labeled red cells. COMPARISON:  01/02/2016 FINDINGS: On the first hour there is no focal area of increased uptake that increases intensity and follows an antegrade progression characteristic of an active GI bleed. A small focus of mild increased uptake can be seen intermittently within the left upper quadrant of the abdomen. On the second hour there is no tubular shaped focus of increasing uptake which progresses in an antegrade fashion characteristic of a GI bleed. Again noted is a faint focus of mild intermittent increased uptake within the left upper quadrant of the abdomen. This is nonspecific. Differential considerations include gastric activity, mild intermittent bleeding from a proximal small bowel, and colonic angiodysplasia could conceivably account for this activity. IMPRESSION: 1. The key diagnostic criteria for scintigraphic gastrointestinal bleeding are not identified on the current exam. This includes: The appearance of activity outside the expected anatomic blood pool structures, a change in the intensity of activity on consecutive images, and movement of activity in a pattern consistent with bowel. 2. On both the first and second hour images there is a nonspecific focus of intermittent activity within the left upper quadrant  of the abdomen. Differential considerations as above. These results will be called to the ordering clinician or representative by the Radiologist Assistant, and communication documented in the PACS or zVision Dashboard. Electronically Signed   By: Signa Kellaylor  Stroud M.D.   On: 01/03/2016 18:24      Scheduled Meds: . cycloSPORINE  1 drop Both Eyes BID  . levothyroxine  100 mcg Oral QAC breakfast  . losartan  50 mg Oral Daily  . metoprolol succinate  100 mg Oral Daily   Continuous Infusions:    LOS: 2 days    Time spent: 20 minutes    MAGICK-Naziya Hegwood, Sherlon HandingISKRA, MD [facility] Pager [phone]  If 7PM-7AM, please contact night-coverage [email or web address] Password [email or web address] 01/04/2016, 3:11 PM

## 2016-01-04 NOTE — Progress Notes (Signed)
[facility] Progress Note  Subjective:  Says that she is hungry and she wants to go "home", back to her living facility.  Esophagram just performed this AM and results not back yet.  Bleeding scan yesterday without definite source of bleeding identified.  Per her nurse, she's had one BM with much less blood since last night.  Objective:  Vital signs in last 24 hours: Temp:  [97.6 F (36.4 C)-98.6 F (37 C)] 98.4 F (36.9 C) (09/30 0456) Pulse Rate:  [53-65] 53 (09/30 0456) Resp:  [16-18] 18 (09/30 0456) BP: (129-147)/(49-93) 146/67 (09/30 0456) SpO2:  [96 %-100 %] 100 % (09/30 0456) Last BM Date: 01/03/16 General:  Alert, elderly and frail, in NAD Heart:  Bradycardic. Pulm:  CTAB.  No W/R/R. Abdomen:  Soft, non-distended.  BS present.  Non-tender. Extremities:  Without edema.  Intake/Output from previous day: 09/29 0701 - 09/30 0700 In: 572 [Blood:572] Out: -   Lab Results:  Recent Labs  01/02/16 0818 01/03/16 0544 01/04/16 0519  WBC 11.1* 9.9 8.0  HGB 9.4* 7.2* 9.8*  HCT 27.8* 21.1* 29.2*  PLT 291 262 231   BMET  Recent Labs  01/02/16 1236 01/03/16 0544 01/04/16 0519  NA 133* 140 137  K 4.4 3.9 3.7  CL 100* 109 109  CO2 24 27 23   GLUCOSE 178* 92 84  BUN 17 17 12   CREATININE 0.69 0.73 0.57  CALCIUM 8.6* 8.4* 8.1*   LFT  Recent Labs  01/02/16 0528  PROT 6.4*  ALBUMIN 3.4*  AST 15  ALT 8*  ALKPHOS 89  BILITOT 0.2*   PT/INR  Recent Labs  01/01/16 2220  LABPROT 12.7  INR 0.95   Nm Gi Blood Loss  Result Date: 01/03/2016 CLINICAL DATA:  Bloody stool earlier today EXAM: NUCLEAR MEDICINE GASTROINTESTINAL BLEEDING SCAN TECHNIQUE: Sequential abdominal images were obtained following intravenous administration of Tc-2665m labeled red blood cells. RADIOPHARMACEUTICALS:  25  mCi Tc-7465m in-vitro labeled red cells. COMPARISON:  01/02/2016 FINDINGS: On the first hour there is no focal area of increased uptake that increases intensity and  follows an antegrade progression characteristic of an active GI bleed. A small focus of mild increased uptake can be seen intermittently within the left upper quadrant of the abdomen. On the second hour there is no tubular shaped focus of increasing uptake which progresses in an antegrade fashion characteristic of a GI bleed. Again noted is a faint focus of mild intermittent increased uptake within the left upper quadrant of the abdomen. This is nonspecific. Differential considerations include gastric activity, mild intermittent bleeding from a proximal small bowel, and colonic angiodysplasia could conceivably account for this activity. IMPRESSION: 1. The key diagnostic criteria for scintigraphic gastrointestinal bleeding are not identified on the current exam. This includes: The appearance of activity outside the expected anatomic blood pool structures, a change in the intensity of activity on consecutive images, and movement of activity in a pattern consistent with bowel. 2. On both the first and second hour images there is a nonspecific focus of intermittent activity within the left upper quadrant of the abdomen. Differential considerations as above. These results will be called to the ordering clinician or representative by the Radiologist Assistant, and communication documented in the PACS or zVision Dashboard. Electronically Signed   By: Signa Kellaylor  Stroud M.D.   On: 01/03/2016 18:24   Assessment / Plan: *GIB:  Likely lower and presumably diverticular in origin as she has extensive diverticular disease on CT scan.  She's had  colonoscopy though it has been many years.  Patient expressed that she really does not want to be put to sleep.  Bleeding scan performed yesterday without definite source of bleeding.  Had one BM since last night with much less blood. *Acute on chronic anemia:  Acutely secondary to GI losses.  Hgb improved s/p 2 units PRBC's. *Dysphagia:  Intermittent for quite some time.  With associated  vomiting/regurgitation of food and beverage contents.  Likely has motility issue such as presbyesophagus.  Awaiting esophagram results that she just had done this AM.  -Observe for now. -Monitor Hgb and transfuse further prn. -Will await esophagram results and once those are back then I suspect we can start her on clear liquids at least.   LOS: 2 days   ZEHR, JESSICA D.  01/04/2016, 8:53 AM  Pager number [phone]

## 2016-01-05 DIAGNOSIS — K224 Dyskinesia of esophagus: Secondary | ICD-10-CM

## 2016-01-05 LAB — CBC
HCT: 29.9 % — ABNORMAL LOW (ref 36.0–46.0)
Hemoglobin: 10.1 g/dL — ABNORMAL LOW (ref 12.0–15.0)
MCH: 28.6 pg (ref 26.0–34.0)
MCHC: 33.8 g/dL (ref 30.0–36.0)
MCV: 84.7 fL (ref 78.0–100.0)
PLATELETS: 260 10*3/uL (ref 150–400)
RBC: 3.53 MIL/uL — ABNORMAL LOW (ref 3.87–5.11)
RDW: 14.7 % (ref 11.5–15.5)
WBC: 10.5 10*3/uL (ref 4.0–10.5)

## 2016-01-05 LAB — TYPE AND SCREEN
ABO/RH(D): A NEG
Antibody Screen: NEGATIVE
UNIT DIVISION: 0
UNIT DIVISION: 0

## 2016-01-05 LAB — BASIC METABOLIC PANEL
Anion gap: 5 (ref 5–15)
BUN: 10 mg/dL (ref 6–20)
CALCIUM: 8.1 mg/dL — AB (ref 8.9–10.3)
CO2: 24 mmol/L (ref 22–32)
CREATININE: 0.54 mg/dL (ref 0.44–1.00)
Chloride: 106 mmol/L (ref 101–111)
GFR calc Af Amer: >60 mL/min (ref >60)
GLUCOSE: 106 mg/dL — AB (ref 65–99)
Potassium: 3.2 mmol/L — ABNORMAL LOW (ref 3.5–5.1)
SODIUM: 135 mmol/L (ref 135–145)

## 2016-01-05 LAB — GLUCOSE, CAPILLARY: GLUCOSE-CAPILLARY: 111 mg/dL — AB (ref 65–99)

## 2016-01-05 MED ORDER — POTASSIUM CHLORIDE 10 MEQ/100ML IV SOLN
10.0000 meq | INTRAVENOUS | Status: AC
  Administered 2016-01-05 (×4): 10 meq via INTRAVENOUS
  Filled 2016-01-05 (×4): qty 100

## 2016-01-05 NOTE — Progress Notes (Signed)
Patient ID: Olivia Nixon, female   DOB: 10-19-1917, 82 y.o.   MRN: [id number]    PROGRESS NOTE    MORGANA ROWLEY  WUJ:[id number] DOB: [birth year] DOA: 01/01/2016  PCP: Ginette Otto, MD   Brief Narrative:  82 y.o. female with CAD status post CABG, hypertension, post radioactive iodine hypothyroidism presented to the ER for evaluation of BRBPR at her living facility. Pt is hard of hearing and difficult to obtain hx from but she says she has had similar events in the past. She also reported sensation of food getting stuck.   Major events since admission: 9/29 - maroon colored stool, Hg drom from 9 --> 7, RN also reported vomiting even after cup of coffee   Assessment & Plan:   Principal Problem:   Acute blood loss anemia due to GI bleed, suspect diverticular bleed  - Hg drop since admission: 14.8 --> 9.6 --> 9.4 --> 7.2 --> 9.8 --> 10.1, appropriate increase in Hg post transfusion  - no indication for transfusion this AM, no indication for colonoscopy at this time  - pt did however have small maroon colored BM this AM so will observe one more night to make sure this does not get worse  - CBC in AM - if recurrent bleed, will need tagged red cells scan by IR  Active Problems:   Dysphagia - esophagogram confirms esophageal dysmotility consistent with presbyesophagus  - will advance diet to soft and see how pt does     HYPOTHYROIDISM, POST-RADIOACTIVE IODINE - continue synthroid     Hypokalemia - supplement and repeat BMP in AM    HTN (hypertension), essential - continue Losartan and Metoprolol once able to tolerate PO   DVT prophylaxis: SCD's Code Status:  Family Communication: Patient at bedside, pt asked me not to update her son, will respect wishes  Disposition Plan: plan for d/c in AM  Consultants:   GI  Procedures:   None  Antimicrobials:   None   Subjective: Reports feeling better this AM.   Objective: Vitals:   01/04/16 1300 01/04/16 2041 01/05/16  0429 01/05/16 1215  BP: 116/63 (!) 144/63 (!) 151/66 (!) 160/78  Pulse: 70 76 67 83  Resp: 18 18 18    Temp: 98.4 F (36.9 C) 98.5 F (36.9 C) 97.9 F (36.6 C)   TempSrc: Oral Oral Oral   SpO2: 98% 97% 97%   Weight:      Height:        Intake/Output Summary (Last 24 hours) at 01/05/16 1235 Last data filed at 01/05/16 0913  Gross per 24 hour  Intake            952.5 ml  Output                0 ml  Net            952.5 ml   Filed Weights   01/02/16 0225  Weight: 48.9 kg (107 lb 12.9 oz)    Examination:  General exam: Appears calm and comfortable, very HOH Respiratory system: Respiratory effort normal. Cardiovascular system: S1 & S2 heard, RRR. No JVD, rubs, gallops or clicks. No pedal edema. Gastrointestinal system: Abdomen is nondistended, soft and nontender. No organomegaly or masses felt.  Central nervous system: Alert and oriented. No focal neurological deficits.  Data Reviewed: I have personally reviewed following labs and imaging studies  CBC:  Recent Labs Lab 01/02/16 0528 01/02/16 0818 01/03/16 0544 01/04/16 0519 01/05/16 0453  WBC 14.8* 11.1* 9.9 8.0  10.5  HGB 9.6* 9.4* 7.2* 9.8* 10.1*  HCT 29.2* 27.8* 21.1* 29.2* 29.9*  MCV 86.4 88.0 86.1 85.4 84.7  PLT 314 291 262 231 260   Basic Metabolic Panel:  Recent Labs Lab 01/02/16 0528 01/02/16 1236 01/03/16 0544 01/04/16 0519 01/05/16 0453  NA 133* 133* 140 137 135  K 4.3 4.4 3.9 3.7 3.2*  CL 100* 100* 109 109 106  CO2 24 24 27 23 24   GLUCOSE 132* 178* 92 84 106*  BUN 17 17 17 12 10   CREATININE 0.61 0.69 0.73 0.57 0.54  CALCIUM 8.8* 8.6* 8.4* 8.1* 8.1*   Liver Function Tests:  Recent Labs Lab 01/01/16 2220 01/02/16 0528  AST 13* 15  ALT 7* 8*  ALKPHOS 83 89  BILITOT 0.3 0.2*  PROT 6.6 6.4*  ALBUMIN 3.4* 3.4*   Coagulation Profile:  Recent Labs Lab 01/01/16 2220  INR 0.95   CBG:  Recent Labs Lab 01/03/16 1922 01/04/16 0523 01/04/16 1912 01/04/16 2327 01/05/16 0651    GLUCAP 93 85 70 94 111*   Anemia Panel:  Recent Labs  01/03/16 0544  VITAMINB12 393  FOLATE 19.9  FERRITIN 34  TIBC 267  IRON 44  RETICCTPCT 1.6   Recent Results (from the past 240 hour(s))  MRSA PCR Screening     Status: None   Collection Time: 01/02/16  9:55 AM  Result Value Ref Range Status   MRSA by PCR NEGATIVE NEGATIVE Final    Comment:        The GeneXpert MRSA Assay (FDA approved for NASAL specimens only), is one component of a comprehensive MRSA colonization surveillance program. It is not intended to diagnose MRSA infection nor to guide or monitor treatment for MRSA infections.       Radiology Studies: Nm Gi Blood Loss  Result Date: 01/03/2016 CLINICAL DATA:  Bloody stool earlier today EXAM: NUCLEAR MEDICINE GASTROINTESTINAL BLEEDING SCAN TECHNIQUE: Sequential abdominal images were obtained following intravenous administration of Tc-29m labeled red blood cells. RADIOPHARMACEUTICALS:  25  mCi Tc-63m in-vitro labeled red cells. COMPARISON:  01/02/2016 FINDINGS: On the first hour there is no focal area of increased uptake that increases intensity and follows an antegrade progression characteristic of an active GI bleed. A small focus of mild increased uptake can be seen intermittently within the left upper quadrant of the abdomen. On the second hour there is no tubular shaped focus of increasing uptake which progresses in an antegrade fashion characteristic of a GI bleed. Again noted is a faint focus of mild intermittent increased uptake within the left upper quadrant of the abdomen. This is nonspecific. Differential considerations include gastric activity, mild intermittent bleeding from a proximal small bowel, and colonic angiodysplasia could conceivably account for this activity. IMPRESSION: 1. The key diagnostic criteria for scintigraphic gastrointestinal bleeding are not identified on the current exam. This includes: The appearance of activity outside the expected  anatomic blood pool structures, a change in the intensity of activity on consecutive images, and movement of activity in a pattern consistent with bowel. 2. On both the first and second hour images there is a nonspecific focus of intermittent activity within the left upper quadrant of the abdomen. Differential considerations as above. These results will be called to the ordering clinician or representative by the Radiologist Assistant, and communication documented in the PACS or zVision Dashboard. Electronically Signed   By: Signa Kell M.D.   On: 01/03/2016 18:24   Dg Esophagus  Result Date: 01/04/2016 CLINICAL DATA:  Worsening of chronic  dysphagia over the past couple weeks. EXAM: ESOPHOGRAM/BARIUM SWALLOW TECHNIQUE: Single contrast examination was performed using water-soluble contrast. FLUOROSCOPY TIME:  Fluoroscopy Time:  1 minutes, 30 seconds (12 mGy) COMPARISON:  None. TECHNIQUE: As the patient is primarily wheelchair bound, she was was positioned right lateral decubitus on the fluoroscopy table. Multiple fluoroscopic images were obtained with the patient lying right lateral decubitus initially centered over the oro and hypopharynx and subsequently throughout the length of the esophagus as she drain continuous sips of water soluble contrast. The patient was then positioned supine and multiple spot fluoroscopic and radiographic images were obtained. Images were reviewed and the procedure was terminated. FINDINGS: Swallowing function appears normal without evidence of aspiration. There is marked dysmotility of the esophagus with lack of a dominant peristaltic stripping wave. No definitive stricture or mass. No evidence of achalasia. Suspected small hiatal hernia. There is normal emptying of the stomach with brisk opacification of the proximal small bowel. IMPRESSION: Marked dysmotility of the esophagus without evidence of stricture, mass or achalasia. Electronically Signed   By: Simonne ComeJohn  Watts M.D.   On:  01/04/2016 15:44      Scheduled Meds: . cycloSPORINE  1 drop Both Eyes BID  . levothyroxine  100 mcg Oral QAC breakfast  . losartan  50 mg Oral Daily  . metoprolol succinate  100 mg Oral Daily   Continuous Infusions:    LOS: 3 days    Time spent: 20 minutes    Debbora PrestoMAGICK-Sianna Garofano, MD [facility] Pager [phone]  If 7PM-7AM, please contact night-coverage [email or web address] Password [email or web address] 01/05/2016, 12:35 PM

## 2016-01-05 NOTE — Progress Notes (Signed)
[facility] Progress Note  Subjective:  One BM this AM with minimal amount of maroon blood per nursing; none reported overnight.  Hgb still hold stable/improved at 10.1 grams.  Objective:  Vital signs in last 24 hours: Temp:  [97.9 F (36.6 C)-98.5 F (36.9 C)] 97.9 F (36.6 C) (10/01 0429) Pulse Rate:  [67-76] 67 (10/01 0429) Resp:  [18] 18 (10/01 0429) BP: (116-151)/(63-66) 151/66 (10/01 0429) SpO2:  [97 %-98 %] 97 % (10/01 0429) Last BM Date: 01/04/16 General:  Alert, thin and frail, in NAD; extremely HOH Heart:  Regular rate and rhythm Pulm:  CTAB.  No W/R/R. Abdomen:  Soft, non-distended.  Normal bowel sounds.  Non-tender. Extremities:  Without edema.  Intake/Output from previous day: 09/30 0701 - 10/01 0700 In: 711.3 [I.V.:711.3] Out: -  Intake/Output this shift: Total I/O In: 241.3 [I.V.:241.3] Out: -   Lab Results:  Recent Labs  01/03/16 0544 01/04/16 0519 01/05/16 0453  WBC 9.9 8.0 10.5  HGB 7.2* 9.8* 10.1*  HCT 21.1* 29.2* 29.9*  PLT 262 231 260   BMET  Recent Labs  01/03/16 0544 01/04/16 0519 01/05/16 0453  NA 140 137 135  K 3.9 3.7 3.2*  CL 109 109 106  CO2 27 23 24   GLUCOSE 92 84 106*  BUN 17 12 10   CREATININE 0.73 0.57 0.54  CALCIUM 8.4* 8.1* 8.1*   Nm Gi Blood Loss  Result Date: 01/03/2016 CLINICAL DATA:  Bloody stool earlier today EXAM: NUCLEAR MEDICINE GASTROINTESTINAL BLEEDING SCAN TECHNIQUE: Sequential abdominal images were obtained following intravenous administration of Tc-82m labeled red blood cells. RADIOPHARMACEUTICALS:  25  mCi Tc-48m in-vitro labeled red cells. COMPARISON:  01/02/2016 FINDINGS: On the first hour there is no focal area of increased uptake that increases intensity and follows an antegrade progression characteristic of an active GI bleed. A small focus of mild increased uptake can be seen intermittently within the left upper quadrant of the abdomen. On the second hour there is no tubular shaped  focus of increasing uptake which progresses in an antegrade fashion characteristic of a GI bleed. Again noted is a faint focus of mild intermittent increased uptake within the left upper quadrant of the abdomen. This is nonspecific. Differential considerations include gastric activity, mild intermittent bleeding from a proximal small bowel, and colonic angiodysplasia could conceivably account for this activity. IMPRESSION: 1. The key diagnostic criteria for scintigraphic gastrointestinal bleeding are not identified on the current exam. This includes: The appearance of activity outside the expected anatomic blood pool structures, a change in the intensity of activity on consecutive images, and movement of activity in a pattern consistent with bowel. 2. On both the first and second hour images there is a nonspecific focus of intermittent activity within the left upper quadrant of the abdomen. Differential considerations as above. These results will be called to the ordering clinician or representative by the Radiologist Assistant, and communication documented in the PACS or zVision Dashboard. Electronically Signed   By: Signa Kell M.D.   On: 01/03/2016 18:24   Dg Esophagus  Result Date: 01/04/2016 CLINICAL DATA:  Worsening of chronic dysphagia over the past couple weeks. EXAM: ESOPHOGRAM/BARIUM SWALLOW TECHNIQUE: Single contrast examination was performed using water-soluble contrast. FLUOROSCOPY TIME:  Fluoroscopy Time:  1 minutes, 30 seconds (12 mGy) COMPARISON:  None. TECHNIQUE: As the patient is primarily wheelchair bound, she was was positioned right lateral decubitus on the fluoroscopy table. Multiple fluoroscopic images were obtained with the patient lying right lateral decubitus initially centered  over the oro and hypopharynx and subsequently throughout the length of the esophagus as she drain continuous sips of water soluble contrast. The patient was then positioned supine and multiple spot  fluoroscopic and radiographic images were obtained. Images were reviewed and the procedure was terminated. FINDINGS: Swallowing function appears normal without evidence of aspiration. There is marked dysmotility of the esophagus with lack of a dominant peristaltic stripping wave. No definitive stricture or mass. No evidence of achalasia. Suspected small hiatal hernia. There is normal emptying of the stomach with brisk opacification of the proximal small bowel. IMPRESSION: Marked dysmotility of the esophagus without evidence of stricture, mass or achalasia. Electronically Signed   By: Simonne ComeJohn  Watts M.D.   On: 01/04/2016 15:44   Assessment / Plan: *GIB: Likely lower and presumably diverticular in origin as she has extensive diverticular disease on CT scan. She's had colonoscopy though it has been many years. Patient expressed that she really does not want to be put to sleep.  Bleeding scan performed 9/29 without definite source of bleeding.  Had one BM this AM with minimal amount of maroon colored blood, none O/N. *Acute on chronic anemia: Acutely secondary to GI losses. Hgb improved s/p 2 units PRBC's. *Dysphagia: Intermittent for quite some time. With associated vomiting/regurgitation of food and beverage contents. Esophagram shows marked dysmotility of the esophagus.  -Continue observation. -Monitor Hgb and transfuse further prn. -Clear liquids for now.  ? If we can just put her on a low residue/soft diet and observe.   LOS: 3 days   Jodiann Ognibene D.  01/05/2016, 9:26 AM  Pager number [phone]

## 2016-01-05 NOTE — Progress Notes (Signed)
Paged MD to make aware of patient having second bloody stool, was instructed to get CBC if patient has another loose stool

## 2016-01-06 LAB — CBC
HEMATOCRIT: 33.6 % — AB (ref 36.0–46.0)
HEMOGLOBIN: 11.3 g/dL — AB (ref 12.0–15.0)
MCH: 29.5 pg (ref 26.0–34.0)
MCHC: 33.6 g/dL (ref 30.0–36.0)
MCV: 87.7 fL (ref 78.0–100.0)
Platelets: 260 10*3/uL (ref 150–400)
RBC: 3.83 MIL/uL — AB (ref 3.87–5.11)
RDW: 14.6 % (ref 11.5–15.5)
WBC: 12.1 10*3/uL — ABNORMAL HIGH (ref 4.0–10.5)

## 2016-01-06 LAB — BASIC METABOLIC PANEL
Anion gap: 10 (ref 5–15)
BUN: 8 mg/dL (ref 6–20)
CHLORIDE: 105 mmol/L (ref 101–111)
CO2: 20 mmol/L — ABNORMAL LOW (ref 22–32)
Calcium: 8.2 mg/dL — ABNORMAL LOW (ref 8.9–10.3)
Creatinine, Ser: 0.59 mg/dL (ref 0.44–1.00)
GFR calc non Af Amer: >60 mL/min (ref >60)
Glucose, Bld: 92 mg/dL (ref 65–99)
POTASSIUM: 4 mmol/L (ref 3.5–5.1)
SODIUM: 135 mmol/L (ref 135–145)

## 2016-01-06 LAB — GLUCOSE, CAPILLARY
Glucose-Capillary: 84 mg/dL (ref 65–99)
Glucose-Capillary: 97 mg/dL (ref 65–99)

## 2016-01-06 MED ORDER — ENSURE ENLIVE PO LIQD
237.0000 mL | Freq: Two times a day (BID) | ORAL | Status: DC
  Administered 2016-01-06 – 2016-01-07 (×2): 237 mL via ORAL

## 2016-01-06 NOTE — Progress Notes (Signed)
Patient ID: Olivia Nixon, female   DOB: 05/01/1917, 82 y.o.   MRN: [id number]    PROGRESS NOTE    Olivia Carpenllen Y Wares  YQM:[id number] DOB: 10/07/1917 DOA: 01/01/2016  PCP: Ginette OttoSTONEKING,HAL THOMAS, MD   Brief Narrative:  82 y.o. female with CAD status post CABG, hypertension, post radioactive iodine hypothyroidism presented to the ER for evaluation of BRBPR at her living facility. Pt is hard of hearing and difficult to obtain hx from but she says she has had similar events in the past. She also reported sensation of food getting stuck.   Major events since admission: 9/29 - maroon colored stool, Hg drom from 9 --> 7, RN also reported vomiting even after cup of coffee   Assessment & Plan:   Principal Problem:   Acute blood loss anemia due to GI bleed, suspect diverticular bleed  - Hg drop since admission: 14.8 --> 9.6 --> 9.4 --> 7.2 --> 9.8 --> 10.1, appropriate increase in Hg post transfusion  - no indication for transfusion this AM, no indication for colonoscopy at this time  - pt did however have small maroon colored BM again this AM so will observe one more night to make sure this does not get worse  - CBC in AM - possible d/c home if no further bloody BM  Active Problems:   Dysphagia - esophagogram confirms esophageal dysmotility consistent with presbyesophagus  - will advance diet to soft and see how pt does     HYPOTHYROIDISM, POST-RADIOACTIVE IODINE - continue synthroid     Hypokalemia - supplemented and WNL this AM - BMP in AM    HTN (hypertension), essential - continue Losartan and Metoprolol once able to tolerate PO   DVT prophylaxis: SCD's Code Status:  Family Communication: Patient at bedside, pt asked me not to update her son, will respect wishes  Disposition Plan: plan for d/c in AM  Consultants:   GI  Procedures:   None  Antimicrobials:   None  Subjective: Reports feeling better this AM.   Objective: Vitals:   01/05/16 1430 01/05/16 2058 01/06/16  0637 01/06/16 1324  BP: (!) 168/70 (!) 144/75 (!) 149/80 (!) 158/68  Pulse: 66 79 63 70  Resp: 18 20 20 18   Temp: 99 F (37.2 C) 99.1 F (37.3 C) 99.1 F (37.3 C) 99.3 F (37.4 C)  TempSrc: Oral Oral Oral Oral  SpO2: 99% 97% 98% 97%  Weight:      Height:        Intake/Output Summary (Last 24 hours) at 01/06/16 1652 Last data filed at 01/06/16 1500  Gross per 24 hour  Intake              480 ml  Output                0 ml  Net              480 ml   Filed Weights   01/02/16 0225  Weight: 48.9 kg (107 lb 12.9 oz)    Examination:  General exam: Appears calm and comfortable, very HOH Respiratory system: Respiratory effort normal. Cardiovascular system: S1 & S2 heard, RRR. No JVD, rubs, gallops or clicks. No pedal edema. Gastrointestinal system: Abdomen is nondistended, soft and nontender. No organomegaly or masses felt.  Central nervous system: Alert and oriented. No focal neurological deficits.  Data Reviewed: I have personally reviewed following labs and imaging studies  CBC:  Recent Labs Lab 01/02/16 0818 01/03/16 0544 01/04/16 0519 01/05/16 [id number]  01/06/16 0352  WBC 11.1* 9.9 8.0 10.5 12.1*  HGB 9.4* 7.2* 9.8* 10.1* 11.3*  HCT 27.8* 21.1* 29.2* 29.9* 33.6*  MCV 88.0 86.1 85.4 84.7 87.7  PLT 291 262 231 260 260   Basic Metabolic Panel:  Recent Labs Lab 01/02/16 1236 01/03/16 0544 01/04/16 0519 01/05/16 0453 01/06/16 0352  NA 133* 140 137 135 135  K 4.4 3.9 3.7 3.2* 4.0  CL 100* 109 109 106 105  CO2 24 27 23 24  20*  GLUCOSE 178* 92 84 106* 92  BUN 17 17 12 10 8   CREATININE 0.69 0.73 0.57 0.54 0.59  CALCIUM 8.6* 8.4* 8.1* 8.1* 8.2*   Liver Function Tests:  Recent Labs Lab 01/01/16 2220 01/02/16 0528  AST 13* 15  ALT 7* 8*  ALKPHOS 83 89  BILITOT 0.3 0.2*  PROT 6.6 6.4*  ALBUMIN 3.4* 3.4*   Coagulation Profile:  Recent Labs Lab 01/01/16 2220  INR 0.95   CBG:  Recent Labs Lab 01/04/16 1912 01/04/16 2327 01/05/16 0651  01/06/16 0023 01/06/16 0724  GLUCAP 70 94 111* 97 84   Anemia Panel: No results for input(s): VITAMINB12, FOLATE, FERRITIN, TIBC, IRON, RETICCTPCT in the last 72 hours. Recent Results (from the past 240 hour(s))  MRSA PCR Screening     Status: None   Collection Time: 01/02/16  9:55 AM  Result Value Ref Range Status   MRSA by PCR NEGATIVE NEGATIVE Final    Comment:        The GeneXpert MRSA Assay (FDA approved for NASAL specimens only), is one component of a comprehensive MRSA colonization surveillance program. It is not intended to diagnose MRSA infection nor to guide or monitor treatment for MRSA infections.       Radiology Studies: No results found.    Scheduled Meds: . cycloSPORINE  1 drop Both Eyes BID  . feeding supplement (ENSURE ENLIVE)  237 mL Oral BID BM  . levothyroxine  100 mcg Oral QAC breakfast  . losartan  50 mg Oral Daily  . metoprolol succinate  100 mg Oral Daily   Continuous Infusions:    LOS: 4 days    Time spent: 20 minutes    Debbora Presto, MD [facility] Pager [phone]  If 7PM-7AM, please contact night-coverage [email or web address] Password [email or web address] 01/06/2016, 4:52 PM

## 2016-01-06 NOTE — Progress Notes (Signed)
Nutrition Follow-up  DOCUMENTATION CODES:   Severe malnutrition in context of chronic illness  INTERVENTION:  - Will order Ensure Enlive po BID, each supplement provides 350 kcal and 20 grams of protein - Tech/RN to assist pt with meal times as needed. - Continue to encourage PO intakes of meals and supplements. - RD will continue to monitor for needs.  NUTRITION DIAGNOSIS:   Malnutrition (Severe) related to chronic illness as evidenced by severe depletion of muscle mass, severe depletion of body fat. -ongoing  GOAL:   Patient will meet greater than or equal to 90% of their needs -unmet  MONITOR:   PO intake, Supplement acceptance, Weight trends, Labs, I & O's  ASSESSMENT:   82 y.o. female with CAD status post CABG, hypertension, post radioactive iodine hypothyroidism presented to the ER for evaluation of BRBPR at her living facility. Pt is hard of hearing and difficult to obtain hx from but she says she has had similar events in the past. She also reported sensation of food getting stuck.   10/2 Per chart review, diet advancement as follows: 9/29 @ 0940: NPO 10/1 @ 0925: CLD 10/1 @ 1050: Soft  Per chart review, pt consumed 50% of breakfast this AM and no other PO intakes documented since diet advancement. Pt is very HOH and does not always answer appropriately. Pt repeatedly states that she wants someone to stay and talk with her d/t feeling depressed and individuals she has attempted to call on the phone have been unable to answer.   Pt not meeting needs. Continue to encourage PO intakes of meals; will order Ensure to supplement and will adjust as needed at follow-up. Per MD note yesterday, pt with dysphagia which was confirmed with esophagram which showed esophageal dysmotility (prebyesophagus).   Medications reviewed; 100 mcg Synthroid/day, PRN Zofran, 10 mEq IV KCl x4 runs yesterday. Labs reviewed; CBGs: 84-111 mg/dL, Ca: 8.2 mg/dL.    9/29 - Patient is very hard of  hearing.  - Even when she did hear the questions asked, she did not always answer appropriately.  - She does endorse she is having trouble swallowing.  - Also reports she has not been eating as well at the facility she lives at compared to when she was at home.  - No family present.  - No recent weight history in chart. Patient was 113-115 lbs in 2015 and >120 lbs prior to 2012.  - Meal Completion: 40% breakfast and 25% of lunch on 9/28 per chart. Pt is NPO now.  - Nutrition-Focused physical exam completed. Findings are severe fat depletion, severe muscle depletion, and no edema.  - Patient meets criteria for severe chronic malnutrition.  - Discussed plan with RN. Pending recommendations from GI team regarding GI bleed and dysphagia.    Diet Order:  DIET SOFT Room service appropriate? Yes; Fluid consistency: Thin  Skin:  Reviewed, no issues  Last BM:  10/1  Height:   Ht Readings from Last 1 Encounters:  01/02/16 5\' 3"  (1.6 m)    Weight:   Wt Readings from Last 1 Encounters:  01/02/16 107 lb 12.9 oz (48.9 kg)    Ideal Body Weight:  52.27 kg  BMI:  Body mass index is 19.1 kg/m.  Estimated Nutritional Needs:   Kcal:  1100-1200  Protein:  50-60 grams  Fluid:  1.1-1.2 L/day  EDUCATION NEEDS:   No education needs identified at this time    Trenton GammonJessica Reichen Hutzler, MS, RD, LDN Inpatient Clinical Dietitian Pager # [phone] After hours/weekend  pager # [phone]

## 2016-01-06 NOTE — Progress Notes (Signed)
Patient ID: Olivia Nixon, female   DOB: 08/15/1917, 82 y.o.   MRN: [id number]    Progress Note   Subjective  If she can find her shoes she is going home... No c/o abdominal discomfort, she is not sure why she is here  Last bloody BM yesterday afternoon hgb stable at 11.3   Objective   Vital signs in last 24 hours: Temp:  [99 F (37.2 C)-99.1 F (37.3 C)] 99.1 F (37.3 C) (10/02 0637) Pulse Rate:  [63-83] 63 (10/02 0637) Resp:  [18-20] 20 (10/02 0637) BP: (144-168)/(70-80) 149/80 (10/02 0637) SpO2:  [97 %-99 %] 98 % (10/02 0637) Last BM Date: 01/05/16 General:  Elderly   white female in NAD, very HOH Heart:  Regular rate and rhythm; no murmurs Lungs: Respirations even and unlabored, lungs CTA bilaterally Abdomen:  Soft, nontender and nondistended. Normal bowel sounds. Extremities:  Without edema. Neurologic:  Alert and oriented,  grossly normal neurologically. Psych:  Cooperative. Normal mood and affect.  Intake/Output from previous day: 10/01 0701 - 10/02 0700 In: 641.3 [I.V.:241.3; IV Piggyback:400] Out: -  Intake/Output this shift: Total I/O In: 240 [P.O.:240] Out: -   Lab Results:  Recent Labs  01/04/16 0519 01/05/16 0453 01/06/16 0352  WBC 8.0 10.5 12.1*  HGB 9.8* 10.1* 11.3*  HCT 29.2* 29.9* 33.6*  PLT 231 260 260   BMET  Recent Labs  01/04/16 0519 01/05/16 0453 01/06/16 0352  NA 137 135 135  K 3.7 3.2* 4.0  CL 109 106 105  CO2 23 24 20*  GLUCOSE 84 106* 92  BUN 12 10 8   CREATININE 0.57 0.54 0.59  CALCIUM 8.1* 8.1* 8.2*   LFT No results for input(s): PROT, ALBUMIN, AST, ALT, ALKPHOS, BILITOT, BILIDIR, IBILI in the last 72 hours. PT/INR No results for input(s): LABPROT, INR in the last 72 hours.  Studies/Results: No results found.     Assessment / Plan:    #1 82 yo female with acute lower GI bleed -likely  diverticular - CT showed diverticulosis, family does not want invasive testing Tagged scan negative on 9/29 One bloody BM  yesterday, none since HGB stable at 11.3  #2 dysphagia- secondary to presbyesophagus  Plan; continue observation, serial hgb's  Bleeding scan for active re-bleeding- IR if bleeding scan +   Principal Problem:   Rectal bleeding Active Problems:   HYPOTHYROIDISM, POST-RADIOACTIVE IODINE   Normochromic normocytic anemia   Hearing loss   HTN (hypertension)   Dysphagia   Hematochezia   Esophageal dysmotility     LOS: 4 days   Lenox Ladouceur  01/06/2016, 9:47 AM

## 2016-01-07 LAB — CBC
HEMATOCRIT: 33.6 % — AB (ref 36.0–46.0)
Hemoglobin: 11.2 g/dL — ABNORMAL LOW (ref 12.0–15.0)
MCH: 29 pg (ref 26.0–34.0)
MCHC: 33.3 g/dL (ref 30.0–36.0)
MCV: 87 fL (ref 78.0–100.0)
PLATELETS: 323 10*3/uL (ref 150–400)
RBC: 3.86 MIL/uL — ABNORMAL LOW (ref 3.87–5.11)
RDW: 14.7 % (ref 11.5–15.5)
WBC: 13.1 10*3/uL — AB (ref 4.0–10.5)

## 2016-01-07 LAB — BASIC METABOLIC PANEL
ANION GAP: 7 (ref 5–15)
BUN: 10 mg/dL (ref 6–20)
CALCIUM: 8.5 mg/dL — AB (ref 8.9–10.3)
CO2: 25 mmol/L (ref 22–32)
Chloride: 102 mmol/L (ref 101–111)
Creatinine, Ser: 0.65 mg/dL (ref 0.44–1.00)
Glucose, Bld: 101 mg/dL — ABNORMAL HIGH (ref 65–99)
Potassium: 3.7 mmol/L (ref 3.5–5.1)
Sodium: 134 mmol/L — ABNORMAL LOW (ref 135–145)

## 2016-01-07 NOTE — Progress Notes (Signed)
Patient is set to discharge back to [facility] today. Patient & son, Lyda JesterCurtis made aware. Discharge packet given to RN, Susie. [facility] called for transport.     Lincoln MaxinKelly Stockton Nunley, LCSW [facility] Clinical Social Worker cell #: [phone]

## 2016-01-07 NOTE — Discharge Instructions (Signed)

## 2016-01-07 NOTE — Care Management Note (Signed)
Case Management Note  Patient Details  Name: Olivia Nixon MRN: [id number] Date of Birth: 12/31/1917  Subjective/Objective:                    Action/Plan:d/c SNF   Expected Discharge Date:                  Expected Discharge Plan:  Skilled Nursing Facility  In-House Referral:  Clinical Social Work  Discharge planning Services  CM Consult  Post Acute Care Choice:    Choice offered to:     DME Arranged:    DME Agency:     HH Arranged:    HH Agency:     Status of Service:  Completed, signed off  If discussed at [employer] Meetings, dates discussed:    Additional Comments:  Lanier ClamMahabir, Dontea Corlew, RN 01/07/2016, 10:52 AM

## 2016-01-07 NOTE — Progress Notes (Signed)
Patient with mark confusion and disorientation during night shift.  Patient was attempting to get out of bed without assistance and insisted that she was not in the hospital.  Called son to see if talking to him would help reorient patient but that did not help.  Patient's impaired hearing also makes it very difficult to communicate with patient.  Will continue to monitor patient for safety.

## 2016-01-07 NOTE — Discharge Summary (Signed)
Physician Discharge Summary  Olivia Nixon ZOX:[id number] DOB: 05/08/1917 DOA: 01/01/2016  PCP: Ginette OttoSTONEKING,HAL THOMAS, MD  Admit date: 01/01/2016 Discharge date: 01/07/2016  Recommendations for Outpatient Follow-up:  1. Pt will need to follow up with PCP in 2-3 weeks post discharge 2. Please obtain BMP to evaluate electrolytes and kidney function 3. Please note that aspirin has been held as pt still with intermittent blood in stool, will defer to PCP to start Aspirin as clinically possible   Discharge Diagnoses:  Principal Problem:   Rectal bleeding Active Problems:   Dysphagia   Hematochezia   Esophageal dysmotility  Discharge Condition: Stable  Diet recommendation: soft diet  Brief Narrative:  82 y.o.femalewith CAD status post CABG, hypertension, post radioactive iodine hypothyroidism presented to the ER for evaluation of BRBPR at her living facility. Pt is hard of hearing and difficult to obtain hx from but she says she has had similar events in the past. She also reported sensation of food getting stuck.   Major events since admission: 9/29 - maroon colored stool, Hg drom from 9 --> 7, RN also reported vomiting even after cup of coffee   Assessment & Plan:   Principal Problem:   Acute blood loss anemia due to GI bleed, suspect diverticular bleed  - Hg drop since admission: 14.8 --> 9.6 --> 9.4 --> 7.2 --> 9.8 --> 10.1, appropriate increase in Hg post transfusion  - no indication for transfusion this AM, no indication for colonoscopy at this time  - pt did however have small maroon colored BM again last night  - GI team has signed off as they had no further recommendations   Active Problems:   Dysphagia - esophagogram confirms esophageal dysmotility consistent with presbyesophagus  - soft diet recommended and pt tolerating well     HYPOTHYROIDISM, POST-RADIOACTIVE IODINE - continue synthroid     Hypokalemia - supplemented and WNL this AM    HTN (hypertension),  essential - continue Losartan and Metoprolol   DVT prophylaxis: SCD's Code Status:  Family Communication: Patient at bedside, pt asked me not to update her son, will respect wishes  Disposition Plan: SNF today   Consultants:   GI  Procedures:   None  Antimicrobials:   None   Procedures/Studies: Nm Gi Blood Loss  Result Date: 01/03/2016 CLINICAL DATA:  Bloody stool earlier today EXAM: NUCLEAR MEDICINE GASTROINTESTINAL BLEEDING SCAN TECHNIQUE: Sequential abdominal images were obtained following intravenous administration of Tc-4751m labeled red blood cells. RADIOPHARMACEUTICALS:  25  mCi Tc-7551m in-vitro labeled red cells. COMPARISON:  01/02/2016 FINDINGS: On the first hour there is no focal area of increased uptake that increases intensity and follows an antegrade progression characteristic of an active GI bleed. A small focus of mild increased uptake can be seen intermittently within the left upper quadrant of the abdomen. On the second hour there is no tubular shaped focus of increasing uptake which progresses in an antegrade fashion characteristic of a GI bleed. Again noted is a faint focus of mild intermittent increased uptake within the left upper quadrant of the abdomen. This is nonspecific. Differential considerations include gastric activity, mild intermittent bleeding from a proximal small bowel, and colonic angiodysplasia could conceivably account for this activity. IMPRESSION: 1. The key diagnostic criteria for scintigraphic gastrointestinal bleeding are not identified on the current exam. This includes: The appearance of activity outside the expected anatomic blood pool structures, a change in the intensity of activity on consecutive images, and movement of activity in a pattern consistent with bowel. 2.  On both the first and second hour images there is a nonspecific focus of intermittent activity within the left upper quadrant of the abdomen. Differential considerations as  above. These results will be called to the ordering clinician or representative by the Radiologist Assistant, and communication documented in the PACS or zVision Dashboard. Electronically Signed   By: Signa Kell M.D.   On: 01/03/2016 18:24   Ct Abdomen Pelvis W Contrast  Result Date: 01/02/2016 CLINICAL DATA:  82 year old female with bloody stools. History of diverticulosis. EXAM: CT ABDOMEN AND PELVIS WITH CONTRAST TECHNIQUE: Multidetector CT imaging of the abdomen and pelvis was performed using the standard protocol following bolus administration of intravenous contrast. CONTRAST:  80mL ISOVUE-300 IOPAMIDOL (ISOVUE-300) INJECTION 61%, 15mL ISOVUE-300 IOPAMIDOL (ISOVUE-300) INJECTION 61% COMPARISON:  Abdominal radiograph dated 10/20/2013 and CT dated 04/20/2003 FINDINGS: Evaluation of this exam is limited due to respiratory motion artifact. Lower chest: Left lung base linear atelectasis/ scarring. There is advanced coronary vascular calcification and CABG vascular clips. No intra-abdominal free air or free fluid. Hepatobiliary: Multiple small stones within the gallbladder. No pericholecystic fluid. There is mild intrahepatic biliary ductal dilatation. The liver appears unremarkable. Pancreas: There is a 2.1 x 1.8 cm ill-defined hypodense area involving the head of the pancreas corresponding to the cystic lesions noted in this region on the CT dated 04/20/2003. There is no associated pancreatic duct dilatation. No peripancreatic stranding. Spleen: Normal in size without focal abnormality. Adrenals/Urinary Tract: The adrenal glands appear unremarkable. Mild bilateral renal atrophy with mild fullness of the renal collecting systems bilaterally. The ureters appear unremarkable. The urinary bladder is distended. Stomach/Bowel: There is a small hiatal hernia with gastroesophageal reflux. No evidence of bowel obstruction or active inflammation. There is extensive sigmoid and descending colonic diverticulosis  without active inflammatory changes. The appendix is not visualized with certainty. No inflammatory changes identified in the right lower quadrant. Vascular/Lymphatic: There is extensive aortoiliac atherosclerotic disease. The aorta is torturous. There is a 2.2 cm infrarenal aortic ectasia. There is atherosclerotic calcification of the origins of the celiac axis, SMA, IMA. The origins of these vessels however remain patent. No portal venous gas identified. There is no adenopathy. Reproductive: Hysterectomy. Other: None Musculoskeletal: Osteopenia with scoliosis and degenerative changes of the spine. No acute fracture. Median sternotomy wires noted. IMPRESSION: Extensive sigmoid diverticulosis without active inflammatory changes. No evidence of bowel obstruction. Gallstones without evidence of acute cholecystitis. Ill-defined hypodensity in the head of the pancreas corresponding to the cystic lesions seen on the prior CT with no significant interval change in size. No dilatation of the main pancreatic duct. Electronically Signed   By: Elgie Collard M.D.   On: 01/02/2016 06:04   Dg Esophagus  Result Date: 01/04/2016 CLINICAL DATA:  Worsening of chronic dysphagia over the past couple weeks. EXAM: ESOPHOGRAM/BARIUM SWALLOW TECHNIQUE: Single contrast examination was performed using water-soluble contrast. FLUOROSCOPY TIME:  Fluoroscopy Time:  1 minutes, 30 seconds (12 mGy) COMPARISON:  None. TECHNIQUE: As the patient is primarily wheelchair bound, she was was positioned right lateral decubitus on the fluoroscopy table. Multiple fluoroscopic images were obtained with the patient lying right lateral decubitus initially centered over the oro and hypopharynx and subsequently throughout the length of the esophagus as she drain continuous sips of water soluble contrast. The patient was then positioned supine and multiple spot fluoroscopic and radiographic images were obtained. Images were reviewed and the procedure was  terminated. FINDINGS: Swallowing function appears normal without evidence of aspiration. There is marked dysmotility of the esophagus  with lack of a dominant peristaltic stripping wave. No definitive stricture or mass. No evidence of achalasia. Suspected small hiatal hernia. There is normal emptying of the stomach with brisk opacification of the proximal small bowel. IMPRESSION: Marked dysmotility of the esophagus without evidence of stricture, mass or achalasia. Electronically Signed   By: Simonne Come M.D.   On: 01/04/2016 15:44   Dg Chest Portable 1 View  Result Date: 01/02/2016 CLINICAL DATA:  Acute onset of generalized abdominal pain and hematochezia. Initial encounter. EXAM: PORTABLE CHEST 1 VIEW COMPARISON:  Chest radiograph performed 10/22/2013 FINDINGS: The lungs are well-aerated. Mild left basilar atelectasis is noted. There is no evidence of pleural effusion or pneumothorax. The cardiomediastinal silhouette is mildly enlarged. The patient is status post median sternotomy, with evidence of prior CABG. No acute osseous abnormalities are seen. IMPRESSION: Mild left basilar atelectasis noted.  Mild cardiomegaly. Electronically Signed   By: Roanna Raider M.D.   On: 01/02/2016 00:05     Discharge Exam: Vitals:   01/06/16 1324 01/06/16 2133  BP: (!) 158/68 (!) 132/92  Pulse: 70 77  Resp: 18 20  Temp: 99.3 F (37.4 C) 97.5 F (36.4 C)   Vitals:   01/05/16 2058 01/06/16 0637 01/06/16 1324 01/06/16 2133  BP: (!) 144/75 (!) 149/80 (!) 158/68 (!) 132/92  Pulse: 79 63 70 77  Resp: 20 20 18 20   Temp:  99.1 F (37.3 C) 99.3 F (37.4 C) 97.5 F (36.4 C)  TempSrc:  Oral Oral Axillary  SpO2: 97% 98% 97% 99%  Weight:      Height:        General: Pt is alert, follows commands appropriately, not in acute distress, HOH Cardiovascular: Regular rate and rhythm, no rubs, no gallops Respiratory: Clear to auscultation bilaterally, no wheezing, no crackles, no rhonchi Abdominal: Soft, non tender,  non distended, bowel sounds +, no guarding   Discharge Instructions  Discharge Instructions    Diet - low sodium heart healthy    Complete by:  As directed    Increase activity slowly    Complete by:  As directed        Medication List    STOP taking these medications   aspirin 81 MG chewable tablet   LORazepam 0.5 MG tablet Commonly known as:  ATIVAN   nitrofurantoin (macrocrystal-monohydrate) 100 MG capsule Commonly known as:  MACROBID   traMADol 50 MG tablet Commonly known as:  ULTRAM     TAKE these medications   acetaminophen 325 MG tablet Commonly known as:  TYLENOL Take 650 mg by mouth 3 (three) times daily.   cycloSPORINE 0.05 % ophthalmic emulsion Commonly known as:  RESTASIS Place 1 drop into both eyes 2 (two) times daily.   diclofenac sodium 1 % Gel Commonly known as:  VOLTAREN Apply 2 g topically 3 (three) times daily. Applied to right knee   feeding supplement (ENSURE COMPLETE) Liqd Take 237 mLs by mouth 3 (three) times daily between meals.   levocetirizine 5 MG tablet Commonly known as:  XYZAL Take 5 mg by mouth daily as needed for allergies.   levothyroxine 100 MCG tablet Commonly known as:  SYNTHROID, LEVOTHROID Take 100 mcg by mouth daily before breakfast. What changed:  Another medication with the same name was removed. Continue taking this medication, and follow the directions you see here.   losartan 50 MG tablet Commonly known as:  COZAAR Take 50 mg by mouth daily.   metoprolol succinate 100 MG 24 hr tablet Commonly known  as:  TOPROL-XL Take 100 mg by mouth daily. Take with or immediately following a meal.   polyethylene glycol packet Commonly known as:  MIRALAX / GLYCOLAX Take 17 g by mouth daily.      Follow-up Information    Ginette Otto, MD .   Specialty:  Internal Medicine Contact information: [address] [place] [address] [phone]            The results of significant  diagnostics from this hospitalization (including imaging, microbiology, ancillary and laboratory) are listed below for reference.     Microbiology: Recent Results (from the past 240 hour(s))  MRSA PCR Screening     Status: None   Collection Time: 01/02/16  9:55 AM  Result Value Ref Range Status   MRSA by PCR NEGATIVE NEGATIVE Final     Labs: Basic Metabolic Panel:  Recent Labs Lab 01/03/16 0544 01/04/16 0519 01/05/16 0453 01/06/16 0352 01/07/16 0505  NA 140 137 135 135 134*  K 3.9 3.7 3.2* 4.0 3.7  CL 109 109 106 105 102  CO2 27 23 24  20* 25  GLUCOSE 92 84 106* 92 101*  BUN 17 12 10 8 10   CREATININE 0.73 0.57 0.54 0.59 0.65  CALCIUM 8.4* 8.1* 8.1* 8.2* 8.5*   Liver Function Tests:  Recent Labs Lab 01/01/16 2220 01/02/16 0528  AST 13* 15  ALT 7* 8*  ALKPHOS 83 89  BILITOT 0.3 0.2*  PROT 6.6 6.4*  ALBUMIN 3.4* 3.4*   CBC:  Recent Labs Lab 01/03/16 0544 01/04/16 0519 01/05/16 0453 01/06/16 0352 01/07/16 0505  WBC 9.9 8.0 10.5 12.1* 13.1*  HGB 7.2* 9.8* 10.1* 11.3* 11.2*  HCT 21.1* 29.2* 29.9* 33.6* 33.6*  MCV 86.1 85.4 84.7 87.7 87.0  PLT 262 231 260 260 323   CBG:  Recent Labs Lab 01/04/16 1912 01/04/16 2327 01/05/16 0651 01/06/16 0023 01/06/16 0724  GLUCAP 70 94 111* 97 84   SIGNED: Time coordinating discharge: 30 minutes  MAGICK-Sallye Lunz, MD  [facility] 01/07/2016, 10:40 AM Pager [phone]  If 7PM-7AM, please contact night-coverage [email or web address] Password [email or web address]

## 2017-04-05 IMAGING — RF DG ESOPHAGUS
8 of 10 series · 12 of 17 positions shown · non-contrast
Comparison: None.

CLINICAL DATA: Worsening of chronic dysphagia over the past couple
weeks.

EXAM:
ESOPHOGRAM/BARIUM SWALLOW
TECHNIQUE: Single contrast examination was performed using water-soluble
contrast.
FLUOROSCOPY TIME:  Fluoroscopy Time:  1 minutes, 30 seconds (12 mGy)
TECHNIQUE: As the patient is primarily wheelchair bound, she was was positioned
right lateral decubitus on the fluoroscopy table.

[Series 1: cp_standard · 0.37mm/px · 2 of 21 frames shown (1 of 6)]
[frame 4/21]
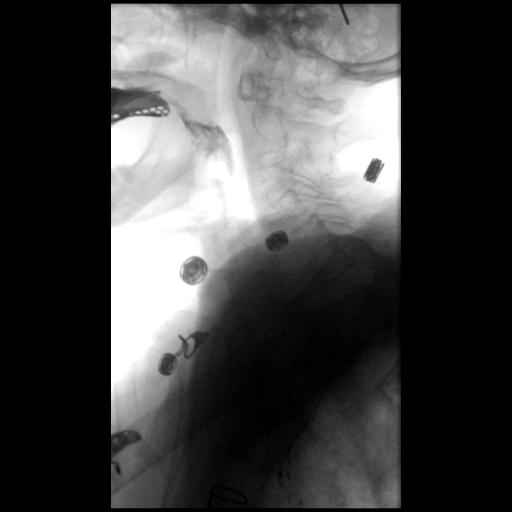
[frame 18/21]
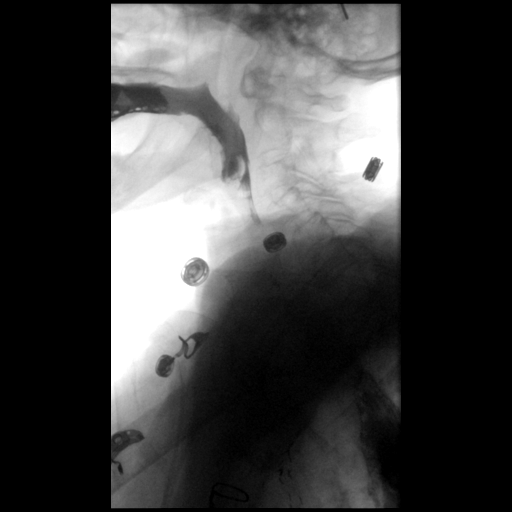

[Series 2: cp_standard · 0.37mm/px · 3 of 38 frames shown (2 of 6)]
[frame 6/38]
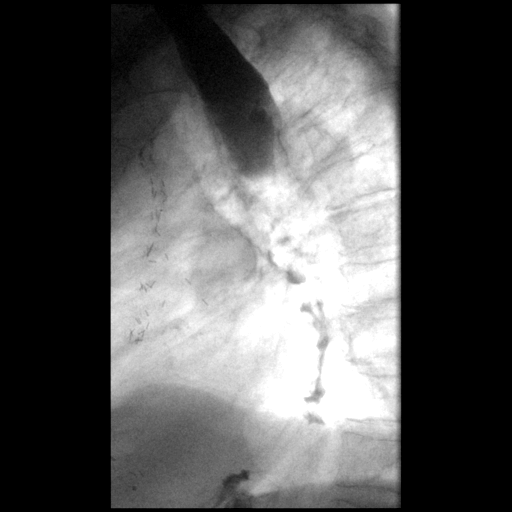
[frame 7/38]
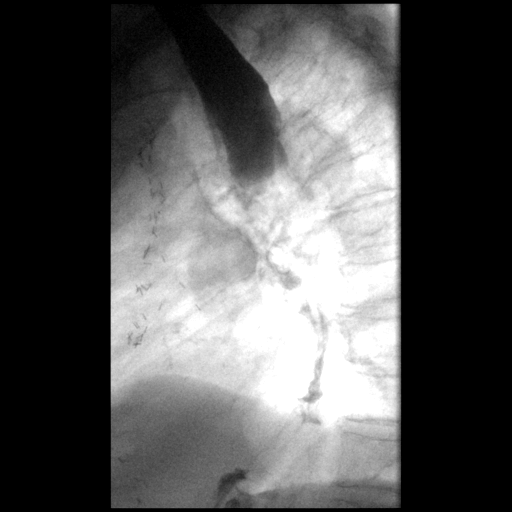
[frame 33/38]
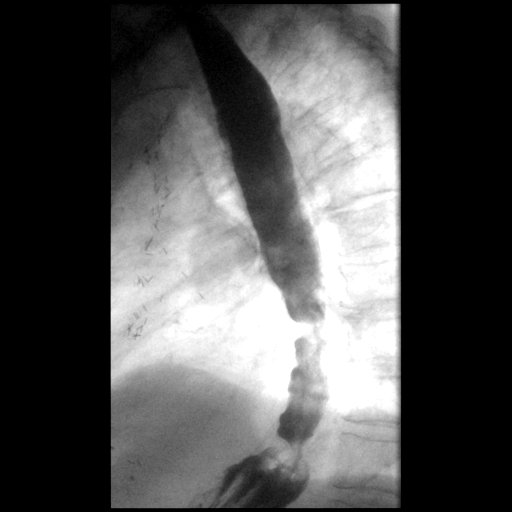

[Series 3: cp_standard · 0.18mm/px · 1 of 1 slices shown (3 of 6)]
[im 1/1]
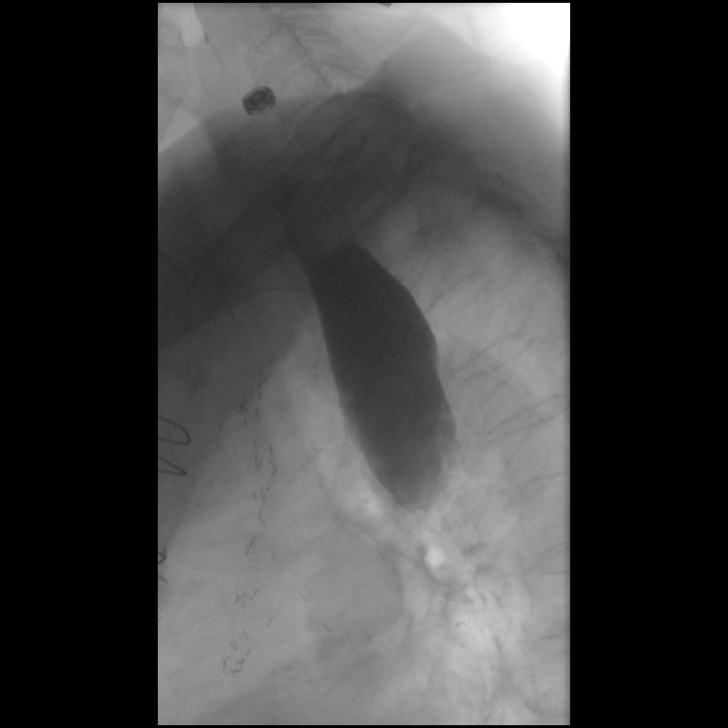

[Series 5: cp_standard · 0.18mm/px · 1 of 1 slices shown (4 of 6)]
[im 1/1]
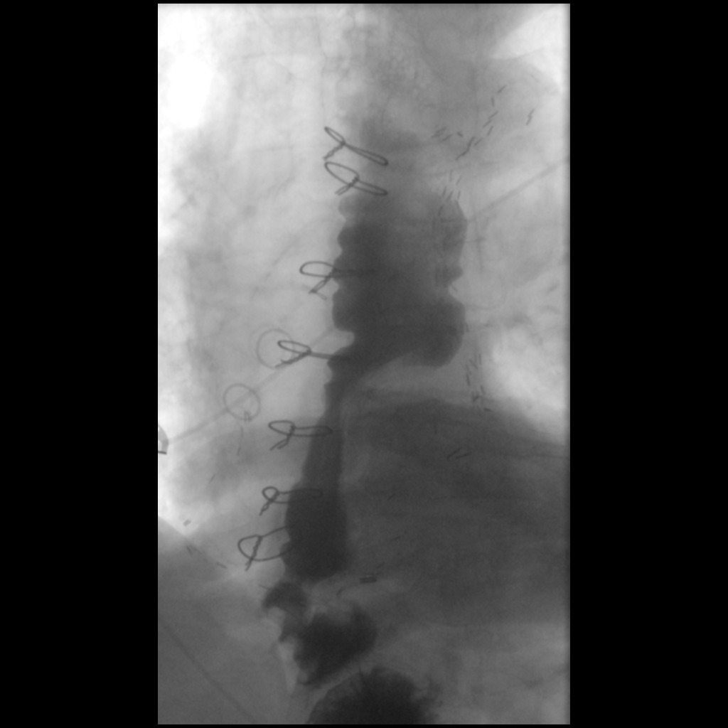

[Series 6: cp_standard · 0.18mm/px · 1 of 1 slices shown (5 of 6)]
[im 1/1]
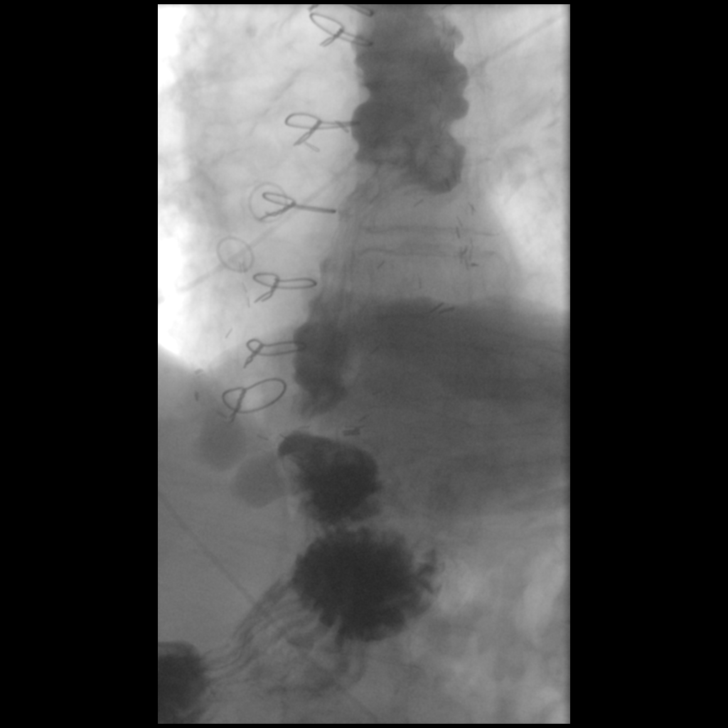

[Series 8: fluoro_barium 2fps_bw · 0.18mm/px · 2 of 2 frames shown (1 of 2)]
[frame 1/2]
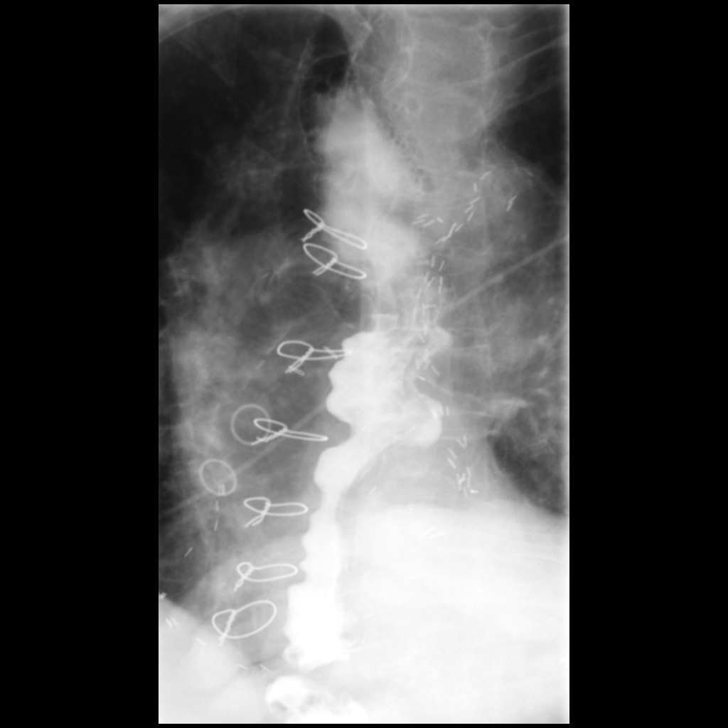
[frame 2/2]
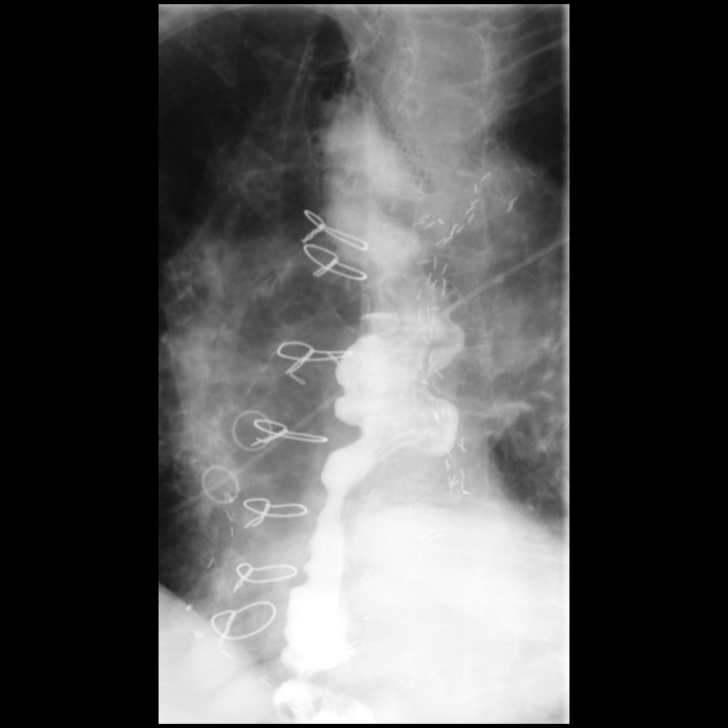

[Series 9: fluoro_barium 2fps_bw · 0.18mm/px · 1 of 2 frames shown (2 of 2)]
[frame 1/2]
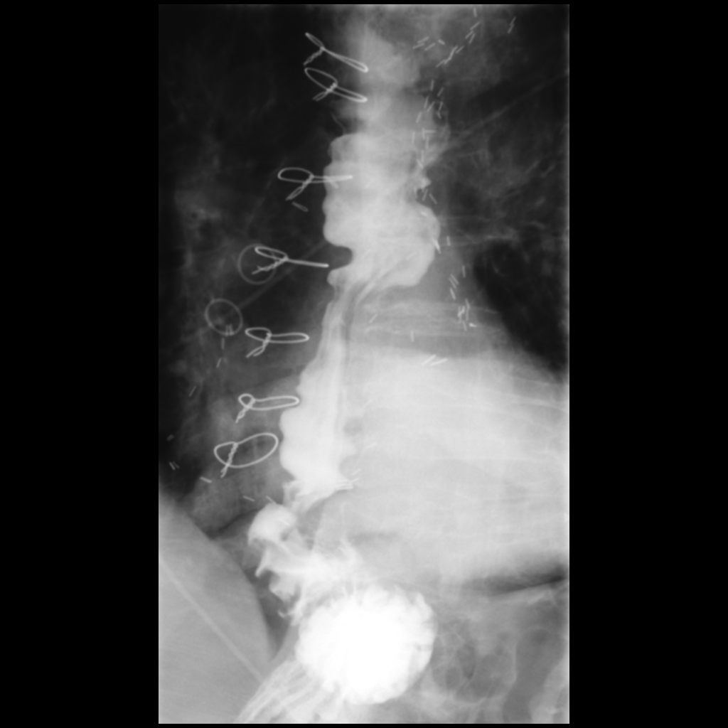

[Series 10: cp_standard · 0.18mm/px · 1 of 1 slices shown (6 of 6)]
[im 1/1]
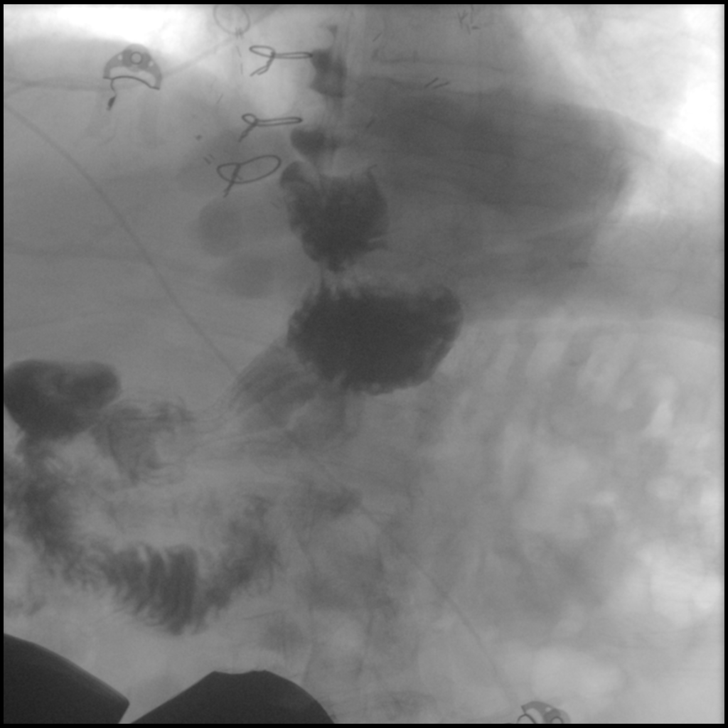

[12 of 17 positions shown; findings below may reference images not displayed]

Multiple fluoroscopic images were obtained with the patient lying
right lateral decubitus initially centered over the oro and
hypopharynx and subsequently throughout the length of the esophagus
as she drain continuous sips of water soluble contrast.

The patient was then positioned supine and multiple spot
fluoroscopic and radiographic images were obtained.

Images were reviewed and the procedure was terminated.
FINDINGS: Swallowing function appears normal without evidence of aspiration.

There is marked dysmotility of the esophagus with lack of a dominant
peristaltic stripping wave. No definitive stricture or mass. No
evidence of achalasia.

Suspected small hiatal hernia.

There is normal emptying of the stomach with brisk opacification of
the proximal small bowel.
IMPRESSION: Marked dysmotility of the esophagus without evidence of stricture,
mass or achalasia.

## 2019-06-05 DEATH — deceased
